# Patient Record
Sex: Female | Born: 1970 | Race: Black or African American | Hispanic: No | Marital: Married | Smoking: Never smoker
Health system: Southern US, Community
[De-identification: ages and names within clinical notes are randomized; demographics above are authoritative.]

## PROBLEM LIST (undated history)

## (undated) DIAGNOSIS — R002 Palpitations: Secondary | ICD-10-CM

## (undated) DIAGNOSIS — O903 Peripartum cardiomyopathy: Secondary | ICD-10-CM

## (undated) DIAGNOSIS — O24419 Gestational diabetes mellitus in pregnancy, unspecified control: Secondary | ICD-10-CM

## (undated) DIAGNOSIS — I5022 Chronic systolic (congestive) heart failure: Secondary | ICD-10-CM

## (undated) DIAGNOSIS — N390 Urinary tract infection, site not specified: Secondary | ICD-10-CM

## (undated) HISTORY — DX: Gestational diabetes mellitus in pregnancy, unspecified control: O24.419

## (undated) HISTORY — DX: Chronic systolic (congestive) heart failure: I50.22

## (undated) HISTORY — DX: Peripartum cardiomyopathy: O90.3

## (undated) HISTORY — PX: TUBAL LIGATION: SHX77

## (undated) HISTORY — DX: Urinary tract infection, site not specified: N39.0

## (undated) HISTORY — DX: Palpitations: R00.2

---

## 2001-12-17 ENCOUNTER — Encounter: Payer: Self-pay | Admitting: Emergency Medicine

## 2001-12-17 ENCOUNTER — Emergency Department (HOSPITAL_COMMUNITY): Admission: EM | Admit: 2001-12-17 | Discharge: 2001-12-17 | Payer: Self-pay | Admitting: *Deleted

## 2001-12-31 ENCOUNTER — Ambulatory Visit (HOSPITAL_COMMUNITY): Admission: RE | Admit: 2001-12-31 | Discharge: 2001-12-31 | Payer: Self-pay | Admitting: Obstetrics and Gynecology

## 2001-12-31 ENCOUNTER — Encounter (INDEPENDENT_AMBULATORY_CARE_PROVIDER_SITE_OTHER): Payer: Self-pay | Admitting: Specialist

## 2002-02-25 ENCOUNTER — Other Ambulatory Visit: Admission: RE | Admit: 2002-02-25 | Discharge: 2002-02-25 | Payer: Self-pay | Admitting: Obstetrics & Gynecology

## 2002-05-03 ENCOUNTER — Other Ambulatory Visit: Admission: RE | Admit: 2002-05-03 | Discharge: 2002-05-03 | Payer: Self-pay | Admitting: Obstetrics and Gynecology

## 2002-10-11 ENCOUNTER — Encounter: Admission: RE | Admit: 2002-10-11 | Discharge: 2002-10-11 | Payer: Self-pay | Admitting: Obstetrics and Gynecology

## 2002-11-05 ENCOUNTER — Inpatient Hospital Stay (HOSPITAL_COMMUNITY): Admission: AD | Admit: 2002-11-05 | Discharge: 2002-11-05 | Payer: Self-pay | Admitting: Obstetrics and Gynecology

## 2002-11-14 ENCOUNTER — Inpatient Hospital Stay (HOSPITAL_COMMUNITY): Admission: AD | Admit: 2002-11-14 | Discharge: 2002-11-16 | Payer: Self-pay | Admitting: Obstetrics and Gynecology

## 2002-12-17 ENCOUNTER — Other Ambulatory Visit: Admission: RE | Admit: 2002-12-17 | Discharge: 2002-12-17 | Payer: Self-pay | Admitting: Obstetrics and Gynecology

## 2003-08-18 ENCOUNTER — Other Ambulatory Visit: Admission: RE | Admit: 2003-08-18 | Discharge: 2003-08-18 | Payer: Self-pay | Admitting: Obstetrics and Gynecology

## 2003-11-27 ENCOUNTER — Inpatient Hospital Stay (HOSPITAL_COMMUNITY): Admission: AD | Admit: 2003-11-27 | Discharge: 2003-12-13 | Payer: Self-pay | Admitting: *Deleted

## 2003-12-11 ENCOUNTER — Encounter (INDEPENDENT_AMBULATORY_CARE_PROVIDER_SITE_OTHER): Payer: Self-pay | Admitting: Specialist

## 2004-01-22 ENCOUNTER — Other Ambulatory Visit: Admission: RE | Admit: 2004-01-22 | Discharge: 2004-01-22 | Payer: Self-pay | Admitting: Obstetrics and Gynecology

## 2006-01-09 ENCOUNTER — Ambulatory Visit: Payer: Self-pay | Admitting: Internal Medicine

## 2006-02-08 ENCOUNTER — Ambulatory Visit: Payer: Self-pay | Admitting: Internal Medicine

## 2006-03-01 ENCOUNTER — Ambulatory Visit: Payer: Self-pay | Admitting: Internal Medicine

## 2006-05-02 ENCOUNTER — Ambulatory Visit: Payer: Self-pay | Admitting: Gastroenterology

## 2006-07-28 ENCOUNTER — Ambulatory Visit: Payer: Self-pay | Admitting: Gastroenterology

## 2007-04-03 ENCOUNTER — Encounter: Admission: RE | Admit: 2007-04-03 | Discharge: 2007-04-03 | Payer: Self-pay | Admitting: Obstetrics and Gynecology

## 2007-05-06 ENCOUNTER — Inpatient Hospital Stay (HOSPITAL_COMMUNITY): Admission: AD | Admit: 2007-05-06 | Discharge: 2007-05-06 | Payer: Self-pay | Admitting: Obstetrics & Gynecology

## 2007-05-09 ENCOUNTER — Ambulatory Visit: Payer: Self-pay | Admitting: Internal Medicine

## 2007-07-13 ENCOUNTER — Inpatient Hospital Stay (HOSPITAL_COMMUNITY): Admission: AD | Admit: 2007-07-13 | Discharge: 2007-07-16 | Payer: Self-pay | Admitting: Obstetrics and Gynecology

## 2007-07-23 ENCOUNTER — Encounter: Payer: Self-pay | Admitting: Internal Medicine

## 2007-07-23 ENCOUNTER — Inpatient Hospital Stay (HOSPITAL_COMMUNITY): Admission: AD | Admit: 2007-07-23 | Discharge: 2007-07-25 | Payer: Self-pay | Admitting: Obstetrics and Gynecology

## 2007-07-23 ENCOUNTER — Ambulatory Visit: Payer: Self-pay | Admitting: Cardiovascular Disease

## 2007-08-10 ENCOUNTER — Ambulatory Visit: Payer: Self-pay | Admitting: Internal Medicine

## 2007-08-10 LAB — CONVERTED CEMR LAB
BUN: 6 mg/dL (ref 6–23)
Basophils Relative: 0.4 % (ref 0.0–1.0)
CO2: 27 meq/L (ref 19–32)
Chloride: 105 meq/L (ref 96–112)
Eosinophils Absolute: 0.2 10*3/uL (ref 0.0–0.6)
Lymphocytes Relative: 46.2 % — ABNORMAL HIGH (ref 12.0–46.0)
Monocytes Relative: 11.3 % — ABNORMAL HIGH (ref 3.0–11.0)
Neutro Abs: 1.8 10*3/uL (ref 1.4–7.7)
Neutrophils Relative %: 38 % — ABNORMAL LOW (ref 43.0–77.0)
RBC: 4.02 M/uL (ref 3.87–5.11)
TSH: 0.37 microintl units/mL (ref 0.35–5.50)
Uric Acid, Serum: 9.7 mg/dL — ABNORMAL HIGH (ref 2.4–7.0)
WBC: 4.7 10*3/uL (ref 4.5–10.5)

## 2007-08-31 ENCOUNTER — Ambulatory Visit: Payer: Self-pay | Admitting: Internal Medicine

## 2007-08-31 LAB — CONVERTED CEMR LAB
BUN: 7 mg/dL (ref 6–23)
Creatinine, Ser: 0.7 mg/dL (ref 0.4–1.2)
GFR calc Af Amer: 122 mL/min
GFR calc non Af Amer: 101 mL/min
Glucose, Bld: 79 mg/dL (ref 70–99)
Potassium: 3.6 meq/L (ref 3.5–5.1)
Pro B Natriuretic peptide (BNP): 13 pg/mL (ref 0.0–100.0)
Sodium: 141 meq/L (ref 135–145)

## 2007-12-26 DIAGNOSIS — J309 Allergic rhinitis, unspecified: Secondary | ICD-10-CM | POA: Insufficient documentation

## 2007-12-26 DIAGNOSIS — Z87448 Personal history of other diseases of urinary system: Secondary | ICD-10-CM | POA: Insufficient documentation

## 2007-12-26 DIAGNOSIS — Z8742 Personal history of other diseases of the female genital tract: Secondary | ICD-10-CM | POA: Insufficient documentation

## 2007-12-27 ENCOUNTER — Ambulatory Visit: Payer: Self-pay | Admitting: Internal Medicine

## 2007-12-27 DIAGNOSIS — L259 Unspecified contact dermatitis, unspecified cause: Secondary | ICD-10-CM | POA: Insufficient documentation

## 2008-03-17 ENCOUNTER — Ambulatory Visit: Payer: Self-pay | Admitting: Internal Medicine

## 2008-03-17 DIAGNOSIS — M25539 Pain in unspecified wrist: Secondary | ICD-10-CM

## 2008-09-26 ENCOUNTER — Ambulatory Visit: Payer: Self-pay | Admitting: Diagnostic Radiology

## 2008-09-26 ENCOUNTER — Ambulatory Visit: Payer: Self-pay | Admitting: Internal Medicine

## 2008-09-26 ENCOUNTER — Ambulatory Visit (HOSPITAL_BASED_OUTPATIENT_CLINIC_OR_DEPARTMENT_OTHER): Admission: RE | Admit: 2008-09-26 | Discharge: 2008-09-26 | Payer: Self-pay | Admitting: Internal Medicine

## 2008-09-26 DIAGNOSIS — R350 Frequency of micturition: Secondary | ICD-10-CM

## 2008-09-26 LAB — CONVERTED CEMR LAB
Nitrite: POSITIVE
Protein, U semiquant: 100
Urobilinogen, UA: 0.2
pH: 6.5

## 2008-09-27 ENCOUNTER — Encounter: Payer: Self-pay | Admitting: Internal Medicine

## 2009-01-02 ENCOUNTER — Encounter: Payer: Self-pay | Admitting: Internal Medicine

## 2009-01-02 ENCOUNTER — Ambulatory Visit: Payer: Self-pay | Admitting: Internal Medicine

## 2009-01-02 DIAGNOSIS — R002 Palpitations: Secondary | ICD-10-CM | POA: Insufficient documentation

## 2009-01-02 DIAGNOSIS — O903 Peripartum cardiomyopathy: Secondary | ICD-10-CM | POA: Insufficient documentation

## 2009-01-07 ENCOUNTER — Ambulatory Visit: Payer: Self-pay | Admitting: Internal Medicine

## 2009-01-07 LAB — CONVERTED CEMR LAB
Albumin: 3.2 g/dL — ABNORMAL LOW (ref 3.5–5.2)
Alkaline Phosphatase: 53 units/L (ref 39–117)
BUN: 3 mg/dL — ABNORMAL LOW (ref 6–23)
Bilirubin, Direct: 0.1 mg/dL (ref 0.0–0.3)
Cholesterol: 174 mg/dL (ref 0–200)
Creatinine, Ser: 0.7 mg/dL (ref 0.4–1.2)
GFR calc non Af Amer: 120.52 mL/min (ref >60)
Hgb A1c MFr Bld: 5.7 % (ref 4.6–6.5)
LDL Cholesterol: 107 mg/dL — ABNORMAL HIGH (ref 0–99)
Potassium: 3.8 meq/L (ref 3.5–5.1)
TSH: 0.27 microintl units/mL — ABNORMAL LOW (ref 0.35–5.50)
Total CHOL/HDL Ratio: 6

## 2009-01-08 ENCOUNTER — Encounter: Payer: Self-pay | Admitting: Internal Medicine

## 2009-01-21 ENCOUNTER — Encounter: Payer: Self-pay | Admitting: Internal Medicine

## 2009-01-21 ENCOUNTER — Ambulatory Visit: Payer: Self-pay

## 2009-03-10 ENCOUNTER — Ambulatory Visit: Payer: Self-pay | Admitting: Internal Medicine

## 2009-03-10 DIAGNOSIS — E0590 Thyrotoxicosis, unspecified without thyrotoxic crisis or storm: Secondary | ICD-10-CM | POA: Insufficient documentation

## 2009-03-10 LAB — CONVERTED CEMR LAB: Free T4: 0.72 ng/dL — ABNORMAL LOW (ref 0.80–1.80)

## 2009-03-12 ENCOUNTER — Telehealth: Payer: Self-pay | Admitting: Internal Medicine

## 2009-04-02 ENCOUNTER — Ambulatory Visit: Payer: Self-pay | Admitting: Internal Medicine

## 2009-06-05 ENCOUNTER — Ambulatory Visit: Payer: Self-pay | Admitting: Internal Medicine

## 2009-06-05 DIAGNOSIS — N390 Urinary tract infection, site not specified: Secondary | ICD-10-CM

## 2009-06-05 LAB — CONVERTED CEMR LAB
Glucose, Urine, Semiquant: NEGATIVE
Urobilinogen, UA: 0.2

## 2009-08-07 ENCOUNTER — Encounter (INDEPENDENT_AMBULATORY_CARE_PROVIDER_SITE_OTHER): Payer: Self-pay | Admitting: *Deleted

## 2009-09-17 ENCOUNTER — Encounter: Payer: Self-pay | Admitting: Internal Medicine

## 2009-09-22 ENCOUNTER — Ambulatory Visit: Payer: Self-pay | Admitting: Cardiology

## 2009-09-22 ENCOUNTER — Encounter: Payer: Self-pay | Admitting: Physician Assistant

## 2009-09-28 LAB — CONVERTED CEMR LAB
BUN: 9 mg/dL (ref 6–23)
Calcium: 9.3 mg/dL (ref 8.4–10.5)
TSH: 0.46 microintl units/mL (ref 0.35–5.50)

## 2009-11-25 ENCOUNTER — Encounter (INDEPENDENT_AMBULATORY_CARE_PROVIDER_SITE_OTHER): Payer: Self-pay | Admitting: *Deleted

## 2010-01-11 ENCOUNTER — Ambulatory Visit: Payer: Self-pay | Admitting: Internal Medicine

## 2010-01-11 DIAGNOSIS — H109 Unspecified conjunctivitis: Secondary | ICD-10-CM | POA: Insufficient documentation

## 2010-01-13 ENCOUNTER — Telehealth: Payer: Self-pay | Admitting: Internal Medicine

## 2010-01-13 ENCOUNTER — Encounter (INDEPENDENT_AMBULATORY_CARE_PROVIDER_SITE_OTHER): Payer: Self-pay | Admitting: *Deleted

## 2010-02-16 ENCOUNTER — Encounter: Payer: Self-pay | Admitting: Internal Medicine

## 2010-02-19 ENCOUNTER — Ambulatory Visit: Payer: Self-pay | Admitting: Internal Medicine

## 2010-07-22 ENCOUNTER — Ambulatory Visit: Payer: Self-pay | Admitting: Internal Medicine

## 2010-07-22 LAB — CONVERTED CEMR LAB
Bilirubin Urine: NEGATIVE
Glucose, Urine, Semiquant: NEGATIVE
Ketones, urine, test strip: NEGATIVE
Protein, U semiquant: 30
Specific Gravity, Urine: <1.005
WBC Urine, dipstick: NEGATIVE

## 2010-07-23 ENCOUNTER — Encounter: Payer: Self-pay | Admitting: Internal Medicine

## 2010-08-19 ENCOUNTER — Ambulatory Visit: Payer: Self-pay | Admitting: Internal Medicine

## 2010-08-19 ENCOUNTER — Ambulatory Visit (HOSPITAL_BASED_OUTPATIENT_CLINIC_OR_DEPARTMENT_OTHER)
Admission: RE | Admit: 2010-08-19 | Discharge: 2010-08-19 | Payer: Self-pay | Source: Home / Self Care | Admitting: Internal Medicine

## 2010-08-19 ENCOUNTER — Telehealth: Payer: Self-pay | Admitting: Internal Medicine

## 2010-08-19 LAB — CONVERTED CEMR LAB: Bilirubin Urine: NEGATIVE

## 2010-10-11 ENCOUNTER — Encounter: Payer: Self-pay | Admitting: Internal Medicine

## 2010-10-18 ENCOUNTER — Ambulatory Visit (INDEPENDENT_AMBULATORY_CARE_PROVIDER_SITE_OTHER)
Admission: RE | Admit: 2010-10-18 | Discharge: 2010-10-18 | Payer: BC Managed Care – PPO | Source: Home / Self Care | Attending: Internal Medicine | Admitting: Internal Medicine

## 2010-10-18 DIAGNOSIS — G2581 Restless legs syndrome: Secondary | ICD-10-CM

## 2010-10-18 DIAGNOSIS — M25569 Pain in unspecified knee: Secondary | ICD-10-CM

## 2010-10-19 ENCOUNTER — Encounter (INDEPENDENT_AMBULATORY_CARE_PROVIDER_SITE_OTHER): Payer: Self-pay | Admitting: *Deleted

## 2010-10-19 NOTE — Letter (Signed)
Summary: Appointment - Missed  [facility], Main Office  [address]   [place], [place] [address]   Phone: [phone]  Fax: [phone]     November 25, 2009 MRN: [id number]   [facility] [address] [place], [place]  [address]   Dear Ms. Popson,  Our records indicate you missed your appointment on 11/20/2009 with Dr. Tenny Craw. It is very important that we reach you to reschedule this appointment. We look forward to participating in your health care needs. Please contact us at the number listed above at your earliest convenience to reschedule this appointment.     Sincerely,   Migdalia Dk [facility] Scheduling Team

## 2010-10-19 NOTE — Letter (Signed)
Summary: Primary Care Consult Scheduled Letter  [place] [facility]  [address]   [place], [place] [address]   Phone: [phone]  Fax: [phone]      01/13/2010 MRN: [id number]  [facility] [address] [place], [place]  [address]    Dear Ms. Elizardo,      We have scheduled an appointment for you.  At the recommendation of Dr._YOO, we have scheduled you a consult with DR Stevphen Rochester, [facility]  on _MAY 17, 2011 at 2:45PM .  Their address is_[address] ,[place] . The office phone number is [phone].  If this appointment day and time is not convenient for you, please feel free to call the office of the doctor you are being referred to at the number listed above and reschedule the appointment.   PLEASE DISCONTINUE ALL ANTIHISTAMINES  3 DAYS PRIOR TO VISIT      It is important for you to keep your scheduled appointments. We are here to make sure you are given good patient care. If you have questions or you have made changes to your appointment, please notify us at  [phone], ask for HELEN.    Thank you, Darral Dash Patient Care Coordinator [place] [facility]

## 2010-10-19 NOTE — Assessment & Plan Note (Signed)
Summary: uti?/mhf   Vital Signs:  Patient profile:   40 year old female Height:      63 inches Weight:      180.50 pounds BMI:     32.09 Temp:     100.8 degrees F oral Pulse rate:   96 / minute Pulse rhythm:   regular Resp:     16 per minute BP sitting:   100 / 70  (right arm) Cuff size:   large  Vitals Entered By: Mervin Kung CMA Duncan Dull) (July 22, 2010 1:30 PM) CC: , , Dysuria Is Patient Diabetic? No Pain Assessment Patient in pain? no      Comments Pt states she is only taking a MVI. Mervin Kung CMA Duncan Dull)  July 22, 2010 1:36 PM    Primary Care Provider:  Dondra Spry DO  CC:  , , and Dysuria.  History of Present Illness:   Dysuria      This is a 40 year old woman who presents with Dysuria.  The patient reports burning with urination and hematuria.  The patient denies the following associated symptoms: nausea, vomiting, and fever. mild chills x 1 day  Preventive Screening-Counseling & Management  Alcohol-Tobacco     Alcohol drinks/day: 0     Alcohol Counseling: not indicated; patient does not drink     Smoking Status: never     Tobacco Counseling: not indicated; no tobacco use  Allergies (verified): No Known Drug Allergies  Past History:  Past Medical History: Peripartum cardiomyopathy Gestational diabetes     Palpitations     Social History: Married, has 3 children, ages 29, 90, and 2.   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  Never Smoked  Alcohol use-no      Physical Exam  General:  alert, well-developed, and well-nourished.  alert, well-developed, and well-nourished.   Lungs:  normal respiratory effort and normal breath sounds.   Heart:  normal rate, regular rhythm, and no gallop.   Abdomen:  mild suprapubic tenderness. no flank tenderness   Impression & Recommendations:  Problem # 1:  UTI (ICD-599.0)  Her updated medication list for this problem includes:    Cefuroxime  Axetil 500 Mg Tabs (Cefuroxime axetil) ..... One by mouth two times a day  Orders: UA Dipstick w/o Micro (manual) ([address]) Rocephin  250mg  (U0454) Admin of Therapeutic Inj  intramuscular or subcutaneous ([address]) Specimen Handling ([address]) T-Culture, Urine ([address])  Encouraged to push clear liquids, get enough rest, and take acetaminophen as needed. To be seen in 10 days if no improvement, sooner if worse.  Her updated medication list for this problem includes:    Cefuroxime Axetil 500 Mg Tabs (Cefuroxime axetil) ..... One by mouth two times a day  Complete Medication List: 1)  Multivitamins Tabs (Multiple vitamin) .Marland Kitchen.. 1 tab qday 2)  Cefuroxime Axetil 500 Mg Tabs (Cefuroxime axetil) .... One by mouth two times a day  Patient Instructions: 1)  Drink plenty of fluids 2)  Take full course antibiotics 3)  Call our office if your symptoms do not  improve or gets worse. Prescriptions: CEFUROXIME AXETIL 500 MG TABS (CEFUROXIME AXETIL) one by mouth two times a day  #14 x 0   Entered and Authorized by:   D. Thomos Lemons DO   Signed by:   D. Thomos Lemons DO on 07/22/2010   Method used:   Electronically to        [facility]  [address] [phone]* (retail)       [address]       [place], [place]  [address]       Ph: [phone]       Fax: [phone]   RxID:   [id number]    Medication Administration  Injection # 1:    Medication: Rocephin  250mg     Diagnosis: UTI (ICD-599.0)    Route: IM    Site: LUOQ gluteus    Exp Date: 07/19/2012    Lot #: [id number]    Mfr: Sandoz    Patient tolerated injection without complications    Given by: Mervin Kung CMA Duncan Dull) (July 22, 2010 1:55 PM)  Orders Added: 1)  UA Dipstick w/o Micro (manual) [81002] 2)  Est. Patient Level III [[address]270] 3)  Rocephin  250mg  [J0696] 4)  Admin of Therapeutic Inj  intramuscular or subcutaneous [96[address]] 5)  Specimen Handling [99000] 6)  T-Culture, Urine [35009-38182] 7)  Est. Patient Level III  [99371]     Current Allergies (reviewed today): No known allergies   Laboratory Results   Urine Tests   Date/Time Reported: Mervin Kung CMA (AAMA)  July 22, 2010 1:50 PM   Routine Urinalysis   Color: yellow Appearance: Clear Glucose: negative   (Normal Range: Negative) Bilirubin: negative   (Normal Range: Negative) Ketone: negative   (Normal Range: Negative) Spec. Gravity: <1.005   (Normal Range: 1.003-1.035) Blood: large   (Normal Range: Negative) pH: 7.5   (Normal Range: 5.0-8.0) Protein: 30   (Normal Range: Negative) Urobilinogen: 2.0   (Normal Range: 0-1) Nitrite: negative   (Normal Range: Negative) Leukocyte Esterace: negative   (Normal Range: Negative)    Comments: Urine cultured. Nicki Guadalajara Fergerson CMA Duncan Dull)  July 22, 2010 1:53 PM

## 2010-10-19 NOTE — Progress Notes (Signed)
Summary: Ultrasound Results  Phone Note Outgoing Call   Summary of Call: call pt - renal u/s normal Initial call taken by: D. Thomos Lemons DO,  August 19, 2010 12:53 PM  Follow-up for Phone Call        call placed to patient at [phone], no answer. A detailed  voice message  was left informing patient per Dr Artist Pais instructions. Message was left for patient to cal if any questions Follow-up by: Glendell Docker CMA,  August 19, 2010 1:55 PM

## 2010-10-19 NOTE — Assessment & Plan Note (Signed)
Summary: Allergies/hea   Vital Signs:  Patient profile:   40 year old female Height:      63 inches Weight:      182.75 pounds BMI:     32.49 O2 Sat:      98 % on Room air Temp:     97.9 degrees F oral Pulse rate:   90 / minute Pulse rhythm:   regular Resp:     18 per minute BP sitting:   100 / 66  (right arm) Cuff size:   large  Vitals Entered By: Glendell Docker CMA (January 11, 2010 1:35 PM)  O2 Flow:  Room air CC: Rm 2- Allergie Comments c/o swelling of face,eye drainage, aggravated allergies, taken otc meds with little improvement   Primary Care Provider:  Dondra Spry DO  CC:  Rm 2- Allergie.  History of Present Illness:       This is a 40 year old female who presents with allergic rhinitis.  The patient complains of nasal congestion, post nasal drip, and itchy eyes.  Patient states symptoms are worse with exposure to outdoors.  Previous effective treatment includes fexofenadine and cetirizine.    prev allergy panel reviewed  Preventive Screening-Counseling & Management  Alcohol-Tobacco     Smoking Status: never  Allergies (verified): No Known Drug Allergies  Past History:  Past Medical History: Peripartum cardiomyopathy Gestational diabetes    Palpitations     Family History: Mother is at age 32, has hypertension and type 2 diabetes.  Father is age 70 with Parkinson's disease.  Patient has 2 brothers and 4 sisters, no known medical issues.  Denies any family history of colon cancer, breast cancer      Social History: Married, has 3 children, ages 40, 59, and 2.   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  Never Smoked  Alcohol use-no     Physical Exam  General:  alert, well-developed, and well-nourished.   Head:  normocephalic and atraumatic.   Eyes:  right conjunctival injection Lungs:  normal respiratory effort and normal breath sounds.   Heart:  normal rate, regular rhythm, and no gallop.      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  no improvement with otc antihistamines.   tx with prednisone taper and start nasal steroids and antihistamine refer to allergist - she would like to consider allergy shots or drops  Her updated medication list for this problem includes:    Veramyst 27.5 Mcg/spray Susp (Fluticasone furoate) .Marland Kitchen... 2 sprays each nostril once daily    Azelastine Hcl 137 Mcg/spray Soln (Azelastine hcl) .Marland Kitchen... 2 sprays each nostril two times a day  Orders: Allergy Referral  (Allergy)  Problem # 2:  CONJUNCTIVITIS (ICD-372.30) right eye is red.  she reports eye discharge in AM.   tx with tobradex.   also start patanol drops Patient advised to call office if symptoms persist or worsen.  Complete Medication List: 1)  Multivitamins Tabs (Multiple vitamin) .Marland Kitchen.. 1 tab qday 2)  Coreg 3.125 Mg Tabs (Carvedilol) .... One half a tablet 2 times per day 3)  Veramyst 27.5 Mcg/spray Susp (Fluticasone furoate) .... 2 sprays each nostril once daily 4)  Prednisone 10 Mg Tabs (Prednisone) .... 3 tabs by mouth once daily x 3 days, 2 tabs by mouth once daily x 3 days, 1 tab by mouth once daily x 3  days 5)  Tobramycin-dexamethasone 0.3-0.1 % Susp (Tobramycin-dexamethasone) .... 2 drops to each eye qid x 1 week 6)  Patanol 0.1 % Soln (Olopatadine hcl) .... One drop to each eye once daily 7)  Azelastine Hcl 137 Mcg/spray Soln (Azelastine hcl) .... 2 sprays each nostril two times a day  Patient Instructions: 1)  Call our office if your symptoms do not  improve or gets worse. Prescriptions: AZELASTINE HCL 137 MCG/SPRAY SOLN (AZELASTINE HCL) 2 sprays each nostril two times a day  #1 bottle x 2   Entered and Authorized by:   D. Thomos Lemons DO   Signed by:   D. Thomos Lemons DO on 01/11/2010   Method used:   Electronically to        [facility] [phone]* (retail)       [address]       [place], [place]   [address]       Ph: [phone]       Fax: [phone]   RxID:   [id number] PATANOL 0.1 % SOLN (OLOPATADINE HCL) one drop to each eye once daily  #1 bottle x 3   Entered and Authorized by:   D. Thomos Lemons DO   Signed by:   D. Thomos Lemons DO on 01/11/2010   Method used:   Electronically to        [facility] [phone]* (retail)       [address]       [place], [place]  [address]       Ph: [phone]       Fax: [phone]   RxID:   [id number] TOBRAMYCIN-DEXAMETHASONE 0.3-0.1 % SUSP (TOBRAMYCIN-DEXAMETHASONE) 2 drops to each eye qid x 1 week  #1 bottle x 0   Entered and Authorized by:   D. Thomos Lemons DO   Signed by:   D. Thomos Lemons DO on 01/11/2010   Method used:   Electronically to        [facility] [phone]* (retail)       [address]       [place], [place]  [address]       Ph: [phone]       Fax: [phone]   RxID:   [id number] PREDNISONE 10 MG TABS (PREDNISONE) 3 tabs by mouth once daily x 3 days, 2 tabs by mouth once daily x 3 days, 1 tab by mouth once daily x 3 days  #18 x 0   Entered and Authorized by:   D. Thomos Lemons DO   Signed by:   D. Thomos Lemons DO on 01/11/2010   Method used:   Electronically to        [facility] [phone]* (retail)       [address]       [place], [place]  [address]       Ph: [phone]       Fax: [phone]   RxID:   [id number] VERAMYST 27.5 MCG/SPRAY SUSP (FLUTICASONE FUROATE) 2 sprays each nostril once daily  #1 bottle x 3   Entered and Authorized by:   D. Thomos Lemons DO   Signed by:   D. Thomos Lemons DO on 01/11/2010   Method used:   Electronically to        [facility] [phone]* (retail)       [address]       [place], [place]  [address]  Ph: [phone]       Fax: [phone]   RxID:   [id number]   Current Allergies (reviewed today): No known allergies    Immunization History:  Influenza Immunization History:    Influenza:  historical (07/07/2009)    Preventive Care Screening  Pap  Smear:    Date:  12/08/2009    Results:  normal

## 2010-10-19 NOTE — Letter (Signed)
Summary: [facility]  [facility]   Imported By: Lanelle Bal 04/06/2010 09:13:41  _____________________________________________________________________  External Attachment:    Type:   Image     Comment:   External Document

## 2010-10-19 NOTE — Assessment & Plan Note (Signed)
Summary: ROV/PALPS   Visit Type:  rov Primary Provider:  Dondra Spry DO  CC:  palpitations...  History of Present Illness: This is a 40 year old female patient, who has a history of a peripartum cardiomyopathy. She also has history of palpitations. Holter monitor back in May of 2010, showed PACs and PVCs. 2-D echo showed mild decreased LV systolic function. Ejection fraction 45-50%. Dr. Tenny Craw, start her on low dose Coreg, a half of a 3.125 mg b.i.d.  The patient comes in today complaining of several week history of increased palpitations. She says some days they occur off-and-on all day long and other days. She doesn't have any. 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 She started exercising a month ago and says she doesn't notice them when she is exercising. She denies associated chest pain, dizziness, dyspnea, or presyncope.  Current Medications (verified): 1)  Multivitamins   Tabs (Multiple Vitamin) .Marland Kitchen.. 1 Tab Qday 2)  Coreg 3.125 Mg Tabs (Carvedilol) .... One Half A Tablet 2 Times Per Day  Allergies (verified): No Known Drug Allergies  Past History:  Past Medical History: Last updated: 06/05/2009 Peripartum cardiomyopathy Gestational diabetes    Palpitations    Social History: Last updated: 06/05/2009 Married, has 3 children, ages 40, 57, and 2.   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  Never Smoked  Alcohol use-no    Review of Systems       see history of present illness, otherwise, negative  Vital Signs:  Patient profile:   40 year old female Height:      63 inches Weight:      193 pounds BMI:     34.31 Pulse rate:   70 / minute Pulse rhythm:   regular BP sitting:   100 / 60  (right arm) Cuff size:   large  Vitals Entered By: Danielle Rankin, CMA (September 22, 2009 12:38 PM)  Physical  Exam  General:   Well-nournished, in no acute distress. Neck: No JVD, HJR, Bruit, or thyroid enlargement Lungs: No tachypnea, clear without wheezing, rales, or rhonchi Cardiovascular: RRR, PMI not displaced, heart sounds normal, no murmurs, gallops, bruit, thrill, or heave. Abdomen: BS normal. Soft without organomegaly, masses, lesions or tenderness. Extremities: without cyanosis, clubbing or edema. Good distal pulses bilateral SKin: Warm, no lesions or rashes  Musculoskeletal: No deformities Neuro: no focal signs    EKG  Procedure date:  09/22/2009  Findings:      normal sinus rhythm, no acute change  Impression & Recommendations:  Problem # 1:  PALPITATIONS (ICD-785.1) Patient complains of several week history of increased palpitations worsen by caffeine use. Have asked her to decrease her caffeine intake. We will increase her Coreg to 3.125 mg one b.i.d. as tolerated. If her blood pressure does not tolerate this she could take it one in the morning and half in the evening. I also told her she could take an extra tablet during the palpitations if needed. She will keep a check on her blood pressure. We will also check a TSH and BMET. Her updated medication list for this problem includes:    Coreg 3.125 Mg Tabs (Carvedilol) ..... One half a tablet 2 times per day  Problem # 2:  PERIPARTUM CARDIOMYPATH DELIV W/WO ANTPRTM COND (ZOX-096.04) Patient's last echo was in May 2010, at which  time a ejection fraction was 45-50%. I will not repeat this. She is asymptomatic.  Other Orders: TLB-BMP (Basic Metabolic Panel-BMET) (80048-METABOL) TLB-TSH (Thyroid Stimulating Hormone) (04540-JWJ)  Patient Instructions: 1)  Your physician recommends that you schedule a follow-up appointment in: 1 - 2 months with Dr. Tenny Craw. 2)  Your physician recommends that you return for lab work today: TSH, BMP. 3)  Your physician has recommended you make the following change in your medication: increase coreg to  one tablet twice a day if blood pressure tolerates it; or, take one tablet in the morning and a half a tablet in the evening if blood pressure tolerates it.

## 2010-10-19 NOTE — Progress Notes (Signed)
Summary: Prior Authorization needed on Veramyst/Medication Change  Phone Note From Pharmacy   Caller: [facility] Reason for Call: Medication not on formulary Summary of Call: fax from pharmacy for prior authorization on Veramyst, not covered by insurance ID # [id number], [phone] Initial call taken by: Glendell Docker CMA,  January 13, 2010 12:03 PM  Follow-up for Phone Call        per Dr Artist Pais change to flonase with same directions Follow-up by: Glendell Docker CMA,  January 13, 2010 6:04 PM  Additional Follow-up for Phone Call Additional follow up Details #1::        call placed to patient at [phone], no answer, voice message left for patient to return call regarding medication change Additional Follow-up by: Glendell Docker CMA,  January 13, 2010 6:07 PM    Additional Follow-up for Phone Call Additional follow up Details #2::    Patient notified. Follow-up by: Lucious Groves,  January 15, 2010 2:24 PM  New/Updated Medications: FLONASE 50 MCG/ACT SUSP (FLUTICASONE PROPIONATE) 2 sprays each nostril once daily Prescriptions: FLONASE 50 MCG/ACT SUSP (FLUTICASONE PROPIONATE) 2 sprays each nostril once daily  #1 x 3   Entered by:   Glendell Docker CMA   Authorized by:   D. Thomos Lemons DO   Signed by:   Glendell Docker CMA on 01/13/2010   Method used:   Electronically to        [facility] [phone]* (retail)       [address]       [place], [place]  [address]       Ph: [phone]       Fax: [phone]   RxID:   [id number]

## 2010-10-21 NOTE — Assessment & Plan Note (Signed)
Summary: uti not better/mhf   Vital Signs:  Patient profile:   40 year old female Height:      63 inches Weight:      180.75 pounds BMI:     32.13 O2 Sat:      100 % on Room air Temp:     98.1 degrees F oral Pulse rate:   68 / minute Resp:     16 per minute BP sitting:   116 / 70  (right arm) Cuff size:   large  Vitals Entered By: Glendell Docker CMA (August 19, 2010 10:12 AM)  O2 Flow:  Room air CC: UTI Is Patient Diabetic? No Pain Assessment Patient in pain? no        Primary Care Provider:  Dondra Spry DO  CC:  UTI.  History of Present Illness: 40 y/o female c/o , stingiing with urination, frequency, and urinary odor, denies temp  seen 2 week ago - symptoms improved with prev abx  denies hx of GU abnormality  Preventive Screening-Counseling & Management  Alcohol-Tobacco     Smoking Status: never  Allergies (verified): No Known Drug Allergies  Past History:  Past Medical History: Peripartum cardiomyopathy Gestational diabetes     Palpitations      Past Surgical History: Tubal ligation  Social History: Married, has 3 children, ages 29, 15, and 2.   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  Never Smoked   Alcohol use-no      Review of Systems       no hx of genitourologic abnormality  Physical Exam  General:  alert, well-developed, and well-nourished.   Lungs:  normal respiratory effort and normal breath sounds.   Heart:  normal rate, regular rhythm, and no gallop.   Abdomen:  soft and non-tender.  mild flank tenderness   Impression & Recommendations:  Problem # 1:  UTI (ICD-599.0) pt got better after prev UTI but symptoms returned prev urine culture showed E Coli - sensitive to cephalosporins  tx with cipro re culture check renal u/s we discussed urologic eval is UTIs continue to recur  The following medications were removed from the medication list:    Cefuroxime Axetil 500 Mg Tabs  (Cefuroxime axetil) ..... One by mouth two times a day Her updated medication list for this problem includes:    Ciprofloxacin Hcl 500 Mg Tabs (Ciprofloxacin hcl) ..... One by mouth two times a day  Orders: UA Dipstick w/o Micro (manual) ([address]) Ultrasound (Ultrasound) T-Culture, Urine ([address]) Specimen Handling ([address])  Complete Medication List: 1)  Multivitamins Tabs (Multiple vitamin) .Marland Kitchen.. 1 tab qday 2)  Ciprofloxacin Hcl 500 Mg Tabs (Ciprofloxacin hcl) .... One by mouth two times a day  Patient Instructions: 1)  Call our office if your symptoms do not  improve or gets worse. Prescriptions: CIPROFLOXACIN HCL 500 MG TABS (CIPROFLOXACIN HCL) one by mouth two times a day  #20 x 0   Entered and Authorized by:   D. Thomos Lemons DO   Signed by:   D. Thomos Lemons DO on 08/19/2010   Method used:   Electronically to        [facility] [phone]* (retail)       [address]       [place], [place]  [address]       Ph: [phone]       Fax: [phone]   RxID:   [id number]  Orders Added: 1)  UA Dipstick w/o Micro (manual) [81002] 2)  Ultrasound [Ultrasound] 3)  T-Culture, Urine [[address]] 4)  Specimen Handling [99[address]] 5)  Est. Patient Level III [09811]    Current Allergies (reviewed today): No known allergies   Laboratory Results   Urine Tests    Routine Urinalysis   Color: straw Appearance: Cloudy Glucose: negative   (Normal Range: Negative) Bilirubin: negative   (Normal Range: Negative) Ketone: negative   (Normal Range: Negative) Spec. Gravity: 1.015   (Normal Range: 1.003-1.035) Blood: small   (Normal Range: Negative) pH: 5.0   (Normal Range: 5.0-8.0) Protein: trace   (Normal Range: Negative) Urobilinogen: 0.2   (Normal Range: 0-1) Nitrite: negative   (Normal Range: Negative) Leukocyte Esterace: large   (Normal Range: Negative)

## 2010-10-27 NOTE — Letter (Signed)
Summary: Primary Care Consult Scheduled Letter  [place] at [facility]  [address]   [place], [place] [address]   Phone: [phone]  Fax: [phone]      10/19/2010 MRN: [id number]  [facility] [address] [place], [place]  [address]    Dear Ms. Pitcock,      We have scheduled an appointment for you.  At the recommendation of Dr.YOO, we have scheduled you a consult with DR GIOFFRE, [facility]  on SATURDAY FEBRUARY 4,2012 at 8:30AM .  Their address is_[address] ,[place]  [id number]. The office phone number is _[phone].  If this appointment day and time is not convenient for you, please feel free to call the office of the doctor you are being referred to at the number listed above and reschedule the appointment.     It is important for you to keep your scheduled appointments. We are here to make sure you are given good patient care.    Thank you,  Darral Dash Patient Care Coordinator [place] at [facility]

## 2010-11-10 NOTE — Assessment & Plan Note (Signed)
Summary: pain in feet/mhf   Vital Signs:  Patient profile:   40 year old female Height:      63 inches Weight:      182.50 pounds BMI:     32.45 O2 Sat:      18 % on Room air Temp:     98.5 degrees F oral Pulse rate:   74 / minute Resp:     18 per minute BP sitting:   102 / 70  (right arm) Cuff size:   large  Vitals Entered By: Glendell Docker CMA (October 18, 2010 3:49 PM)  O2 Flow:  Room air CC: Pain in legs Is Patient Diabetic? No Pain Assessment Patient in pain? yes     Location: Legs& Feet Intensity: 5 Type: aching Onset of pain  With activity Comments c/o bilatral leg and feet and leg pain for the pase month    Primary Care Provider:  Dondra Spry DO  CC:  Pain in legs.  History of Present Illness: 40 y/o female c/o bilateral leg pain no injury or trauma symptoms much worse at night  also co left knee pain  Preventive Screening-Counseling & Management  Alcohol-Tobacco     Smoking Status: never  Allergies (verified): No Known Drug Allergies  Past History:  Past Medical History: Peripartum cardiomyopathy Gestational diabetes      Palpitations      Family History: Mother is at age 47, has hypertension and type 2 diabetes.  Father is age 78 with Parkinson's disease.  Patient has 2 brothers and 4 sisters, no known medical issues.  Denies any family history of colon cancer, breast cancer       Social History: Married, has 3 children, ages 75, 28, and 2.   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  Never Smoked   Alcohol use-no       Physical Exam  General:  alert, well-developed, and well-nourished.   Lungs:  normal respiratory effort and normal breath sounds.   Heart:  normal rate, regular rhythm, and no gallop.   Msk:  no redness over joints and no joint instability.   Pulses:  dorsalis pedis and posterior tibial pulses are full and equal bilaterally   Impression & Recommendations:  Problem #  1:  KNEE PAIN, LEFT (ICD-719.46) refer to ortho for further eval and tx  Orders: Orthopedic Referral (Ortho)  Problem # 2:  RESTLESS LEG SYNDROME (ICD-333.94)  aching legs esp at night trial of requip rule out iron deficiency   Orders: T-Basic Metabolic Panel [phone]) T-CBC No Diff [id number]) T-TSH [id number]) T-Iron Binding Capacity (TIBC) ([id number]) T-Iron ([id number]) T-Ferritin (10272-53664)  Complete Medication List: 1)  Multivitamins Tabs (Multiple vitamin) .Marland Kitchen.. 1 tab qday 2)  Ropinirole Hcl 0.25 Mg Tabs (Ropinirole hcl) .... 1/2 by mouth at bedtime x 2 weeks, then 1 by mouth at bedtime  Patient Instructions: 1)  Please schedule a follow-up appointment in 2 months. Prescriptions: ROPINIROLE HCL 0.25 MG TABS (ROPINIROLE HCL) 1/2 by mouth at bedtime x 2 weeks, then 1 by mouth at bedtime  #30 x 1   Entered and Authorized by:   D. Thomos Lemons DO   Signed by:   D. Thomos Lemons DO on 10/18/2010   Method used:   Electronically to        [facility] [phone]* (retail)       [address]       [place], [place]  [address]       Ph: [phone]       Fax: [phone]   RxID:   [id number]    Orders Added: 1)  T-Basic Metabolic Panel [id number] 2)  T-CBC No Diff [[id number]] 3)  T-TSH [27253-66440] 4)  T-Iron Binding Capacity (TIBC) [[address]] 5)  T-Iron [[id number]] 6)  T-Ferritin [[id number]] 7)  Orthopedic Referral [Ortho] 8)  Est. Patient Level III [30160]   Immunization History:  Influenza Immunization History:    Influenza:  historical (08/05/2010)   Immunization History:  Influenza Immunization History:    Influenza:  Historical (08/05/2010)  Current Allergies (reviewed today): No known allergies

## 2010-11-22 ENCOUNTER — Encounter: Payer: Self-pay | Admitting: Family

## 2010-12-13 ENCOUNTER — Encounter: Payer: Self-pay | Admitting: Family

## 2010-12-14 ENCOUNTER — Ambulatory Visit (INDEPENDENT_AMBULATORY_CARE_PROVIDER_SITE_OTHER): Payer: BC Managed Care – PPO | Admitting: Internal Medicine

## 2010-12-14 ENCOUNTER — Encounter: Payer: Self-pay | Admitting: Internal Medicine

## 2010-12-14 DIAGNOSIS — E785 Hyperlipidemia, unspecified: Secondary | ICD-10-CM

## 2010-12-14 DIAGNOSIS — Z23 Encounter for immunization: Secondary | ICD-10-CM

## 2010-12-14 DIAGNOSIS — J309 Allergic rhinitis, unspecified: Secondary | ICD-10-CM | POA: Insufficient documentation

## 2010-12-14 DIAGNOSIS — R6889 Other general symptoms and signs: Secondary | ICD-10-CM

## 2010-12-14 DIAGNOSIS — R7989 Other specified abnormal findings of blood chemistry: Secondary | ICD-10-CM

## 2010-12-14 DIAGNOSIS — R002 Palpitations: Secondary | ICD-10-CM

## 2010-12-14 DIAGNOSIS — G2581 Restless legs syndrome: Secondary | ICD-10-CM

## 2010-12-14 DIAGNOSIS — Z0000 Encounter for general adult medical examination without abnormal findings: Secondary | ICD-10-CM

## 2010-12-14 LAB — CBC
Hemoglobin: 12.9 g/dL (ref 12.0–15.0)
MCHC: 34.3 g/dL (ref 30.0–36.0)
RBC: 4.42 MIL/uL (ref 3.87–5.11)
RDW: 14.2 % (ref 11.5–15.5)

## 2010-12-14 MED ORDER — MOMETASONE FUROATE 50 MCG/ACT NA SUSP: 2.0000 | Freq: Every day | NASAL | Status: DC

## 2010-12-14 MED ORDER — FEXOFENADINE HCL 180 MG PO TABS: 180.0000 mg | ORAL_TABLET | Freq: Every day | ORAL | Status: DC

## 2010-12-14 NOTE — Assessment & Plan Note (Signed)
Reviewed adult health maintenance protocols. Pt updated with Tdap Pt counseled on diet and exercise (calorie counting and meal planning)

## 2010-12-14 NOTE — Patient Instructions (Signed)
Our office will contact you re:  Blood test results If your palpitations get worse, I suggest you follow up with Dr. Tenny Craw Please try to avoid caffeine use [email or web address]

## 2010-12-14 NOTE — Assessment & Plan Note (Signed)
Pt experiencing seasonal flare.  Use allegra and nasonex as directed

## 2010-12-14 NOTE — Progress Notes (Signed)
  Subjective:    Patient ID: Danielle Burns, female    DOB: 10/06/1970, 40 y.o.   MRN: [id number]  HPI 40 y/o female for routine CPX  Interval hx:  Seen by Dr. Darrelyn Hillock.  Tried cortisone shot .  Only helped x 1 week. MRI of left knee planned  Obesity - she joined gym.  Lost 5 lbs but wt comes right back  RLS symptoms much better.  She never tried requip  Hx of palpitations - worse than usual.  No dizziness but notices heart races.  Has to cough to slow down heart rate. Previous w/u with Dr. Tenny Craw - holter negative for significant arrythmia She has hx of post partum cardiomyopathy.   2 D echo repeated in 01/2009  1. Left ventricle: The cavity size was normal. Wall thickness was    normal. Systolic function was mildly reduced. The estimated    ejection fraction was in the range of 45% to 50%. Wall motion was    normal; there were no regional wall motion abnormalities.    Features are consistent with a pseudonormal left ventricular    filling pattern, with concomitant abnormal relaxation and    increased filling pressure (grade 2 diastolic dysfunction). 2. Tricuspid valve: Trivial regurgitation. 3. Pericardium, extracardiac: A trivial pericardial effusion was    identified.   Review of Systems  Constitutional: Negative for activity change, appetite change and unexpected weight change.  HENT: Negative for hearing loss.   Eyes: Negative.  Negative for visual disturbance.  Respiratory: Negative for chest tightness and shortness of breath.   Cardiovascular: Positive for palpitations. Negative for chest pain.  Gastrointestinal: Negative for abdominal distention.  Hematological: Negative.   Psychiatric/Behavioral: Negative.        Objective:   Physical Exam  Constitutional: She appears well-developed and well-nourished. No distress.  HENT:  Head: Normocephalic and atraumatic.  Right Ear: External ear normal.  Eyes: Conjunctivae are normal.  Neck: Normal range of motion. Neck supple.    Cardiovascular: Normal rate, regular rhythm and normal heart sounds.   Lymphadenopathy:    She has no cervical adenopathy.     c/o allergy sypmtoms - itchy eyes, nasal congestion    Assessment & Plan:

## 2010-12-14 NOTE — Assessment & Plan Note (Addendum)
Pt having recurrent palpitations (likey benign PACs or PVCs) EKG is normal Check electrolytes, CBC and TFTs She has hx of post partum cardiomyopathy 2 D Echo 01/2009 1. Left ventricle: The cavity size was normal. Wall thickness was    normal. Systolic function was mildly reduced. The estimated    ejection fraction was in the range of 45% to 50%. Wall motion was    normal; there were no regional wall motion abnormalities.    Features are consistent with a pseudonormal left ventricular    filling pattern, with concomitant abnormal relaxation and    increased filling pressure (grade 2 diastolic dysfunction). 2. Tricuspid valve: Trivial regurgitation. 3. Pericardium, extracardiac: A trivial pericardial effusion was    identified.  Pt advised to decrease caffeine use.   If persistent symptoms, pt advised to follow up with Dr. Tenny Craw

## 2010-12-15 ENCOUNTER — Encounter: Payer: Self-pay | Admitting: Internal Medicine

## 2010-12-15 LAB — BASIC METABOLIC PANEL WITH GFR
BUN: 10 mg/dL (ref 6–23)
Chloride: 103 mEq/L (ref 96–112)
Glucose, Bld: 68 mg/dL — ABNORMAL LOW (ref 70–99)
Potassium: 4 mEq/L (ref 3.5–5.3)
Sodium: 140 mEq/L (ref 135–145)

## 2010-12-15 LAB — LIPID PANEL
Cholesterol: 205 mg/dL — ABNORMAL HIGH (ref 0–200)
HDL: 66 mg/dL (ref >39)
Triglycerides: 100 mg/dL (ref <150)

## 2010-12-15 LAB — IBC PANEL: TIBC: 375 ug/dL (ref 250–470)

## 2010-12-21 NOTE — Letter (Signed)
   [facility] [address] [place], [place]  [address]  [place] Phone: [phone]      December 15, 2010   Danielle Burns [address] [place], [place] [address]  RE:  LAB RESULTS  Dear  [name],  The following is an interpretation of your most recent lab tests.  Please take note of any instructions provided or changes to medications that have resulted from your lab work.  ELECTROLYTES:  Good - no changes needed  KIDNEY FUNCTION TESTS:  Good - no changes needed  LIPID PANEL:  Good - no changes needed Triglyceride: 100   Cholesterol: 205   LDL: 119   HDL: 66   Chol/HDL%:  3.1 Ratio  THYROID STUDIES:  Thyroid studies normal TSH: 1.134      CBC:  Good - no changes needed  Iron studies - normal       Sincerely Yours,    Dr. Thomos Lemons  Appended Document:  Mailed.

## 2011-02-01 NOTE — H&P (Signed)
Danielle Burns, Danielle Burns                 ACCOUNT NO.:  [id number]   MEDICAL RECORD NO.:  [id number]          PATIENT TYPE:  INP   LOCATION:  9374                          [facility]   PHYSICIAN:  Lenoard Aden, M.D.DATE OF BIRTH:  [birth year]   DATE OF ADMISSION:  07/23/2007  DATE OF DISCHARGE:                              HISTORY & PHYSICAL   CHIEF COMPLAINT:  Shortness of breath.   She is a 40 year old African American female status post uncomplicated  delivery by C-section on 07/13/07 who presents with a 3-day history of  increased shortness of breath.   SHE HAS NO KNOWN DRUG ALLERGIES.   MEDICATIONS:  1. Prenatal vitamins.  2. Previously on glyburide.   She has a family history of sickle cell trait, tuberculosis, chronic  hypertension, diabetes.  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  She had a vaginal delivery complicated by gestational diabetes,  another vaginal delivery complicated by gestational diabetes, and a 3rd  vaginal delivery complicated by premature labor.   PHYSICAL EXAM:  She is an uncomfortable-appearing African American  female in mild respiratory distress.  She is currently on nasal cannula  O2 upon my appearance.  Blood pressure is 130s/70s.  Pulse of 70.  Respirations remain currently at 18 to 20.  HEENT:  Normal.  LUNGS:  Clear in the upper bases but partially decreased breath sounds  in bilateral lower bases are noted.  HEART:  Bradycardic.  No murmurs, rubs, or gallops noted.  ABDOMEN:  Soft and nontender.  Abdominal incision transverse skin  incision from C-section intact.  EXTREMITIES:  Reveal 1 to 2+ edema.  No vaginal bleeding noted.  DTRs are 2+.  No evidence of clonus.  PELVIC:  Deferred.  NEUROLOGIC:  Nonfocal.   Chest x-ray reveals bilateral pleural effusion consistent with  pulmonary  edema and cardiomegaly.  She has laboratory values remarkable for  hemoglobin of 8.3 and chemistries revealing an elevated LDH, elevated  uric acid, normal liver function tests, and otherwise normal complete  metabolic profile with a normal creatinine of 0.7.   IMPRESSION:  Postpartum shortness of breath, rule out postpartum  preeclampsia versus embolism, gout versus probable peripartum  cardiomyopathy.   PLAN:  Give supportive care, saline lock, O2, 20 of Lasix IV.  We will  call for cardiology consult, probable echocardiogram, and optimization  therapy pending cardiac consult.      Lenoard Aden, M.D.  Electronically Signed     RJT/MEDQ  D:  07/23/2007  T:  07/24/2007  Job:  [id number]   cc:   Lenoard Aden, M.D.  Fax: [phone]

## 2011-02-01 NOTE — Assessment & Plan Note (Signed)
[facility]                            CARDIOLOGY OFFICE NOTE   NAME:Danielle Burns, Danielle Burns                          MRN:          [id number]  DATE:08/10/2007                            DOB:          03/11/1971    IDENTIFICATION:  The patient is a 40 year old woman who was recently  admitted to [facility] in early November for shortness of breath.  She is status post cesarean section on July 13, 2007, (para 3).   The patient was found to be in heart failure.  Echocardiogram was done  that showed an LVEF of 35-40% with mild mitral regurgitation.  She was  diuresed and sent home on Lasix and potassium.  I do not have a  discharge summary or the initial cardiology consult for review.  It  appears that the patient's blood pressure would not tolerate any other  medicines.   Since seen, her shortness of breath and leg swelling have improved.  She  is taking activities as tolerated.  She is breastfeeding.  She denies  chest pain, no PND.   CURRENT MEDICATIONS:  1. Multivitamin daily.  2. Lasix 20 mg b.i.d.  3. Potassium 20 mg daily.  4. Iron supplement, question dose daily.   PHYSICAL EXAMINATION:  GENERAL:  The patient is in no distress.  VITAL SIGNS:  Blood pressure 113/70, pulse 77 and regular, weight 201  pounds.  NECK:  JVP is normal.  LUNGS:  Clear without rales or wheezes.  HEART:  Regular rate and rhythm S1 and S2, no S3 and no murmurs.  ABDOMEN:  Benign and slightly obese.  EXTREMITIES:  No edema.   12-lead EKG; normal sinus rhythm at 77 beats per minute.   IMPRESSION:  The patient is a 40 year old peripartum cardiomyopathy.  Volume status looks fairly good today.  I would like to check labs (BNP,  TSH, BMET, uric acid, CBC, and platelets as they were somewhat abnormal  in the hospital, and follow her electrolytes and fluid status).  She  should be on a beta blocker and/or ACE inhibitor.  I think her blood  pressure would tolerate, but  she is breastfeeding.  I have spent time  talking to her about the benefits of these medicines from the cardiac  standpoint.  I think for her they outweigh and should take precedence  over continued breastfeeding.  She seems to understand and agrees to  proceed with tapering or weaning of her infant to the bottle.   I will set to see her back in four weeks to see how she is doing,  recheck a blood pressure, review her volume status.  Again she was  encouraged to continue as she is doing, watch salt and excess fluid.     Pricilla Riffle, MD, [facility]  Electronically Signed    PVR/MedQ  DD: 08/12/2007  DT: 08/12/2007  Job #: [id number]   cc:   Maxie Better, M.D.

## 2011-02-01 NOTE — Op Note (Signed)
NAMETALLEY, CASCO                 ACCOUNT NO.:  [id number]   MEDICAL RECORD NO.:  [id number]          PATIENT TYPE:  INP   LOCATION:  9302                          [facility]ILITY:  [facility]   PHYSICIAN:  Maxie Better, M.D.DATE OF BIRTH:  Apr 01, 1971   DATE OF PROCEDURE:  07/13/2007  DATE OF DISCHARGE:                               OPERATIVE REPORT   PREOPERATIVE DIAGNOSES:  1. Fetal macrosomia.  2. Class A2 gestational diabetes.  3. Intrauterine gestation at 36+ weeks, desires sterilization,      biophysical profile of 4/8.   PROCEDURE:  Primary cesarean section, Sharl Ma hysterotomy, modified Pomeroy  tubal ligation.   POSTOPERATIVE DIAGNOSES:  1. Fetal macrosomia.  2. Class A2 gestational diabetes.  3. Desires sterilization, biophysical profile 4/8, intrauterine      gestation at 36+ weeks.   ANESTHESIA:  Spinal.   SURGEON:  Maxie Better, M.D.   ASSISTANT:  Gerri Spore B. Earlene Plater, M.D.   INDICATIONS FOR PROCEDURE:  40 year old gravida 5, para 3-0-1-3 married  black female with class A2 gestational diabetes on glyburide now at 36+  weeks who was found on ultrasound to have evidence of estimated fetal  weight of 9 pounds with associated oligohydramnios and biophysical  profile of 4/8.  The patient also desires permanent sterilization.  Given the findings of fetal macrosomia and the poor biophysical profile,  decision was made to proceed with delivery.  Surgical risks were  reviewed with the patient.  Consent was signed.  The patient was  transferred to the operating room.   PROCEDURE:  Under adequate spinal anesthesia, the patient was placed in  the supine position with a left lateral tilt.  She was sterilely prepped  and draped in usual fashion.  An indwelling Foley catheter was sterilely  placed.  Marcaine 0.25% was injected along the planned Pfannenstiel skin  incision site.   A Pfannenstiel skin incision was then made and carried down to the  rectus fascia.  The rectus  fascia was opened transversely.  The rectus  fascia was then bluntly and sharply dissected off the rectus muscle in  superior and inferior fashion.  The rectus muscle was split in the  midline.  The parietal peritoneum was entered sharply and extended.  The  lower uterine segment was developed; however, there were prominent  vessels across the lower uterine segment.  The vesicouterine peritoneum  was opened transversely.  The bladder was then bluntly dissected off the  lower uterine segment and displaced inferiorly using a bladder  retractor.  A curvilinear low transverse uterine incision was then made  and extended with bandage scissors.  A large amount of bleeding was  noted at that time due to the prominent vessels.  Initial attempt at  delivery of the baby was unsuccessful from the vertex position.  The  baby subsequently had turned in a transverse back-down position.  After  manipulation, the vertex was then placed back in the longitudinal  position.  A Kiwi suction was then applied, and subsequent delivery of a  live female was accomplished.  The baby was bulb suctioned on the abdomen.  The cord was clamped and cut.  The baby was transferred to the awaiting  pediatrician with Apgars of 8 at 1 minutes and 9 at 5 minutes.  The  placenta was manually removed.  The uterine cavity was cleaned of  debris.  The uterine incision had no extension and was closed in two  layers, the first layer with 0 Monocryl running locked stitch, the  second layer was imbricated using 0 Monocryl sutures.  Bleeding was  noted on the right side.  A figure-of-eight suture was then placed.  Another figure-of-eight was placed on the left.   Attention was then turned to the tubes.  Both fallopian tubes were  identified down to the fimbriated ends.  Both ovaries appeared normal.  The midportion of both fallopian tubes was grasped with a Babcock.  The  underlying mesosalpinx was opened with cautery.  The proximal and  distal  portion of the tubes were tied with 0 chromic sutures proximally and  distally x2, and the intervening segment of tube was removed on both  sides.   At that point, attention was then turned back to the uterine incision.  It was then noted that there was some bleeding on the left angle.  A  figure-of-eight suture was then placed.  Subsequent pumping active  bleeding was then noted at which time the uterus was carefully  exteriorized, and an O'Leary suture had to be placed on the left in  order to facilitate hemostasis.  The abdomen was then copiously  irrigated and suctioned of debris.  The uterus was then carefully  returned back to the abdomen.  Both tubal segments were identified and  were still intact, with the sutures in place.  The incision had evidence  of  good hemostasis, and at that point the decision was then made to  close the abdomen.  The parietal peritoneum was not closed.  The rectus  fascia was closed with 0 Vicryl x2.  The subcutaneous area was irrigated  and small bleeders cauterized.  Interrupted 2-0 plain sutures was then  placed.  The skin was approximated using Ethicon staples.   SPECIMENS:  Placenta not sent to pathology.  Portion of the right and  left fallopian tubes sent to pathology.   ESTIMATED BLOOD LOSS:  800 mL.   INTRAOPERATIVE FLUID:  2500 mL crystalloid.   URINE OUTPUT:  20 mL clear yellow urine.   COUNTS:  Sponge and instrument counts x2 were correct.   COMPLICATION:  None.   INFANT DATA:  The weight of the baby was 7 pounds 12 ounces.   The patient tolerated the procedure well and was transferred to the  recovery room in stable condition.      Maxie Better, M.D.  Electronically Signed     [place]/MEDQ  D:  07/13/2007  T:  07/15/2007  Job:  [id number]

## 2011-02-01 NOTE — Assessment & Plan Note (Signed)
[facility]                            CARDIOLOGY OFFICE NOTE   Danielle Burns, Danielle Burns                          MRN:          [id number]  DATE:10/12/2007                            DOB:          [birth year]    IDENTIFICATION:  This is a phone conversation with Danielle Burns.   HISTORY:  A 40 year old woman with peripartum cardiomyopathy.  I saw her  back in December.   I had Coreg to her regimen 3.125 b.i.d.  She says since that time she  says she feels like at night her arms are aching.  She feels like she is  not getting enough circulation to them.   I have asked her to stop the Coreg and then call back in a couple weeks  and let us know how she is feeling.   Note today she denies any dizziness, no shortness of breath.     Pricilla Riffle, MD, [facility]  Electronically Signed    PVR/MedQ  DD: 10/12/2007  DT: 10/12/2007  Job #: [id number]

## 2011-02-01 NOTE — Discharge Summary (Signed)
Danielle Burns, HEFEL                 ACCOUNT NO.:  [id number]   MEDICAL RECORD NO.:  [id number]          PATIENT TYPE:  INP   LOCATION:  9374                          [facility]   PHYSICIAN:  Lenoard Aden, M.D.DATE OF BIRTH:  Nov 20, 1970   DATE OF ADMISSION:  07/23/2007  DATE OF DISCHARGE:  07/25/2007                               DISCHARGE SUMMARY   The patient was admitted postpartum status with shortness of breath and  bilateral pulmonary edema with cardiomegaly on July 23, 2007. She was  seen by cardiology, treated with aggressive diuresis and monitored in  the ICU. She did well.  Condition was improved. Upon further followup,  she was discharged to home on hospital day #3 with a diagnosis of  peripartum cardiomyopathy.  Echocardiogram was done.   DISCHARGE MEDICATIONS:  Included Lasix and potassium.   She is to follow-up with cardiology within 2 weeks. Discharge teaching  was done.  She is to follow up in the OB-GYN office in 4-6 weeks.      Lenoard Aden, M.D.  Electronically Signed     RJT/MEDQ  D:  08/29/2007  T:  08/29/2007  Job:  [id number]

## 2011-02-01 NOTE — Assessment & Plan Note (Signed)
[facility]                            CARDIOLOGY OFFICE NOTE   MIKAYELA, DEATS                          MRN:          [id number]  DATE:08/31/2007                            DOB:          05-Mar-1971    IDENTIFICATION:  Ms. Steadman is a 40 year old with a history of  pericardium cardiomyopathy.  I last saw her back in November.   In the interval, she has done okay.  She says she is somewhat more short  of breath and has noted a little bit of swelling in her feet and hands.  Her diet, she says, is unchanged.   CURRENT MEDICATIONS:  1. Multivitamins.  2. Lasix 20 b.i.d. (the patient does not note much urine response with      this).  3. Potassium 20 mEq daily.  4. Iron pills.   PHYSICAL EXAM:  The patient is in no distress.  Blood pressure 113/76, pulse 75, weight 205, up 4 pounds from last  visit.  LUNGS:  Relatively clear, though anteriorly, I hear a faint wheeze.  CARDIAC:  Regular rate and rhythm.  S1, S2.  No S3.  ABDOMEN:  Obese, benign.  EXTREMITIES:  Trace edema.   IMPRESSION:  Pericardium cardiomyopathy.  Volume status may be up a bit.  I would like to get another BMET and a BNP.  Her potassium was  borderline.  I am thinking I will switch her Lasix to 40 daily, possibly  40 alternating with 20 daily, but we will need to know what to do with  her potassium.  Based on this also I would like to add Coreg to her  regimen now that she is no longer breastfeeding.  I will be in touch  with her regarding this later today.   ADDENDUM:  Followup for a few weeks.     Pricilla Riffle, MD, [facility]  Electronically Signed    PVR/MedQ  DD: 08/31/2007  DT: 08/31/2007  Job #: [id number]

## 2011-02-04 NOTE — Op Note (Signed)
[facility]  Patient:    LEIGHANN, AMADON Visit Number: [id number] MRN: [id number]          Service Type: DSU Location: [facility] Attending Physician:  Rhina Brackett Dictated by:   Duke Salvia. Marcelle Overlie, M.D. Proc. Date: 12/31/01 Admit Date:  12/31/2001                             Operative Report  PREOPERATIVE DIAGNOSIS:       Incomplete abortion.  POSTOPERATIVE DIAGNOSIS:      Incomplete abortion.  PROCEDURE:                    Dilation and evacuation.  SURGEON:                      Duke Salvia. Marcelle Overlie, M.D.  ANESTHESIA:                   Sedation plus paracervical block.  PROCEDURE AND FINDINGS:       The patient was taken to the operating room. After a satisfactory level of sedation was obtained with the patients legs in stirrups, the perineum and vagina were prepped and draped in the usual manner for D&E.  The bladder was drained.  Examination under anesthesia was carried. The uterus was eight weeks in size and midposition.  Adnexa negative. Paracervical block was then created by infiltrated at 3 and 9 oclock submucosally 5-7 cc of 1% Xylocaine on either side after negative aspiration. The uterus was sounded to 8-9 cm and progressively dilated to a #29 Shawnie Pons.  A #8 curved suction curet was then used to curet a moderate amount of tissue. When no further tissue could be removed, a small blunt curet was used to explore the cavity, revealing it to be clean.  There was minimal bleeding. She tolerated this well and went to the recovery room in good condition. Dictated by:   Duke Salvia. Marcelle Overlie, M.D. Attending Physician:  Rhina Brackett DD:  12/31/01 TD:  12/31/01 Job: [id number]

## 2011-02-04 NOTE — H&P (Signed)
[facility]  Patient:    Danielle Burns, Danielle Burns Visit Number: [id number] MRN: [id number]          Service Type: DSU Location: [facility] Attending Physician:  Rhina Brackett Dictated by:   Duke Salvia. Marcelle Overlie, M.D. Admit Date:  12/31/2001                           History and Physical  CHIEF COMPLAINT:              Incomplete AB.  HISTORY OF PRESENT ILLNESS:   A 40 year old G1 P0 at eight weeks gestation presented with some spotting, had an ultrasound done at [facility] that showed an intrauterine pregnancy.  No FHR was noted.  She was followed by quantitative HCG analysis.  Initially on December 25, 2001 was 12,574; on April 11 was basically the same at 12,852.  Repeat ultrasound in our office showed a sac that had not changed from a prior ultrasound, no evidence of fetal pole. She presents now for a D&E.  PAST MEDICAL HISTORY:  ALLERGIES:                    None.  OPERATIONS:                   None.  REVIEW OF SYSTEMS:            Otherwise unremarkable.  PHYSICAL EXAMINATION:  VITAL SIGNS:                  Temperature 98.2, blood pressure 120/72.  HEENT:                        Unremarkable.  NECK:                         Supple without masses.  LUNGS:                        Clear.  CARDIOVASCULAR:               Regular rate and rhythm without murmurs, rubs, or gallops noted.  BREASTS:                      Not examined.  ABDOMEN:                      Soft, flat, and nontender.  PELVIC:                       Vagina and cervix normal.  Uterus was eight weeks size, mid position.  Adnexa negative.  IMPRESSION:                   Incomplete abortion.  PLAN:                         D&E.  This procedure including risks of bleeding, infection, other complications that may require open or additional surgery are reviewed with her, which she understands and accepts.  Will arrange to have her blood type checked also. Dictated by:   Duke Salvia. Marcelle Overlie,  M.D. Attending Physician:  Rhina Brackett DD:  12/31/01 TD:  12/31/01 Job: [id number]

## 2011-02-04 NOTE — Discharge Summary (Signed)
Danielle Burns, Danielle Burns                 ACCOUNT NO.:  [id number]   MEDICAL RECORD NO.:  [id number]          PATIENT TYPE:  INP   LOCATION:  9302                          FACILITY:  [facility]   PHYSICIAN:  Maxie Better, M.D.DATE OF BIRTH:  1971-01-27   DATE OF ADMISSION:  07/13/2007  DATE OF DISCHARGE:  07/16/2007                               DISCHARGE SUMMARY   ADMISSION DIAGNOSES:  1. Fetal macrosomia.  2. Class A2 gestational diabetes.  3. Intrauterine gestation at 36+ weeks.  4. Desires sterilization.  Biophysical profile 4/8.  5. Oligohydramnios   DISCHARGE DIAGNOSES:  1. Intrauterine gestation at 36+ weeks, delivered.  2. Fetal macrosomia.  3. Class A2 gestational diabetes.  4. Desires sterilization.  Biophysical profile 4/8.  5. Oligohydramnios.   PROCEDURE:  1. Primary cesarean section.  2. Modified Pomeroy tubal ligation.   HISTORY OF PRESENT ILLNESS:  A 40 year old gravida 5, para 3-0-1-3  female with class A2 gestational diabetes at 36+ weeks who was found to  have an estimated fetal weight of 9 pounds on ultrasound with associated  oligohydramnios and biophysical profile of 4/8.  The patient desired  permanent sterilization.  Given an unfavorable cervix and large for  gestational age baby, the decision was made to proceed with a primary  cesarean section  for  fetal macrosomia in a gestational diabetic  patient.   HOSPITAL COURSE:  The patient was admitted.  She was taken to the  operating room where she underwent a primary cesarean section and  modified Pomeroy tubal ligation.  She had a live female, Apgars of 8and 9.  Normal tubes and ovaries.  The patient had an uncomplicated  postoperative course.  CBC on postoperative day #1 showed a hemoglobin  of 7.7, hematocrit of 22.3, white count 11.4, platelet count 172,000.  She had a preop hemoglobin of 10, hematocrit 29.5.  By postoperative day  #3, the patient was ready for discharge.  She was tolerating a regular  diet.  Incision had no evidence of erythema, induration or exudate.  She  was asymptomatic from her anemia and therefore iron supplementation was  the only treatment.  She was deemed well enough to be discharged home.  Her incision had no evidence of infection.   DISPOSITION:  Home.   CONDITION:  Stable.   DISCHARGE MEDICATIONS:  1. Percocet 1-2 tablets every 4-6 ounces p.r.n. pain.  2. Chromagen Forte one p.o. b.i.d.  3. Prenatal vitamins one p.o. daily.  4. Motrin 600 mg one p.o. q.8h. p.r.n. pain.   FOLLOWUP:  Follow-up appointment at [facility] in 6 weeks.   DISCHARGE INSTRUCTIONS:  Per the postpartum booklet given.      Maxie Better, M.D.  Electronically Signed     [place]/MEDQ  D:  08/14/2007  T:  08/14/2007  Job:  [id number]

## 2011-02-04 NOTE — Discharge Summary (Signed)
Danielle Burns, Danielle Burns                             ACCOUNT NO.:  [id number]   MEDICAL RECORD NO.:  [id number]                   PATIENT TYPE:  INP   LOCATION:  9315                                 [facility]ACILITY:  [facility]   PHYSICIAN:  Lenoard Aden, M.D.             DATE OF BIRTH:  06/14/1971   DATE OF ADMISSION:  11/27/2003  DATE OF DISCHARGE:  12/13/2003                                 DISCHARGE SUMMARY   HOSPITAL COURSE:  The patient was admitted for third trimester bleeding on  November 27, 2003.  Course was uncomplicated.  Previously on magnesium  tocolysis with good response.  Contractions and bleeding had ceased at that  time.  Magnesium was discontinued.  Gestational diabetes was diagnosed based  on glucose testing previously.  Diet control was established with minimal  use of subcutaneous insulin therapy.  Endocrinology consult was obtained.  Third trimester bleeding had remained stable.  The patient remained  hospitalized on expectant management with good glucose control and good and  reassuring fetal heart rate surveillance.  The patient proceeded to go into  spontaneous labor at 33 and four-sevenths weeks.  She had an uncomplicated  vaginal delivery of a female on December 11, 2003.  Postoperative course was  uncomplicated.  Discharged to home on day #2.  Discharge teaching done.  Follow up in the office in 4-6 weeks.                                               Lenoard Aden, M.D.    RJT/MEDQ  D:  01/16/2004  T:  01/16/2004  Job:  [id number]

## 2011-03-09 ENCOUNTER — Telehealth: Payer: Self-pay | Admitting: Internal Medicine

## 2011-03-09 NOTE — Telephone Encounter (Signed)
Pt would like record of last tetanus shot mailed to her.

## 2011-03-09 NOTE — Telephone Encounter (Signed)
Immunization printed and mailed to patient

## 2011-03-15 ENCOUNTER — Telehealth: Payer: Self-pay | Admitting: *Deleted

## 2011-03-15 NOTE — Telephone Encounter (Signed)
Received message from pt stating she did not see documentation of tetanus inj. Received this year. Immunization history shows Tdap was given 12/14/10. Printout was mailed to pt on 03/09/11. Left message on machine for pt to return my call.

## 2011-03-18 NOTE — Telephone Encounter (Signed)
Pt returned my call and states she has received the documentation that she needed.

## 2011-05-02 ENCOUNTER — Ambulatory Visit: Payer: BC Managed Care – PPO | Admitting: Internal Medicine

## 2011-06-03 ENCOUNTER — Ambulatory Visit (INDEPENDENT_AMBULATORY_CARE_PROVIDER_SITE_OTHER): Payer: BC Managed Care – PPO | Admitting: Internal Medicine

## 2011-06-03 ENCOUNTER — Encounter: Payer: Self-pay | Admitting: Internal Medicine

## 2011-06-03 VITALS — BP 106/71 | HR 61 | Ht 63.0 in | Wt 189.0 lb

## 2011-06-03 DIAGNOSIS — R002 Palpitations: Secondary | ICD-10-CM

## 2011-06-03 DIAGNOSIS — I509 Heart failure, unspecified: Secondary | ICD-10-CM

## 2011-06-03 DIAGNOSIS — O903 Peripartum cardiomyopathy: Secondary | ICD-10-CM

## 2011-06-03 NOTE — Progress Notes (Addendum)
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  I saw her last in 2011. Since seen she has done OK.  She is not on any meds.  She denies CP.  Breathing is OK.  Does get occasional swelling in feet.  No palpitations.  No syncope.  No Known Allergies  Current Outpatient Prescriptions  Medication Sig Dispense Refill  . Multiple Vitamin (MULTIVITAMIN) tablet Take 1 tablet by mouth daily.          Past Medical History  Diagnosis Date  . Gestational diabetes   . Palpitations   . Peripartum cardiomyopathy     Past Surgical History  Procedure Date  . Tubal ligation     Family History  Problem Relation Age of Onset  . Diabetes Mother     type II  . Hypertension Mother   . Parkinsonism Father   . Cancer Neg Hx     negative for breast and colon    History   Social History  . Marital Status: Married    Spouse Name: N/A    Number of Children: 3  . Years of Education: N/A   Occupational History  . RN    Social History Main Topics  . Smoking status: Never Smoker   . Smokeless tobacco: Not on file  . Alcohol Use: No  . Drug Use:   . Sexually Active:    Other Topics Concern  . Not on file   Social History Narrative   Originally from [place].Accompanied by son - [place] 3 yrs2 sons and 2 daughters    Review of Systems:  All systems reviewed.  They are negative to the above problem except as previously stated.  Vital Signs: BP 106/71  Pulse 61  Ht 5\' 3"  (1.6 m)  Wt 189 lb (85.73 kg)  BMI 33.48 kg/m2  Physical Exam  Patient is in NAD HEENT:  Normocephalic, atraumatic. EOMI, PERRLA.  Neck: JVP is normal. No thyromegaly. No bruits.  Lungs: clear to auscultation. No rales no wheezes.  Heart: Regular rate and rhythm. Normal S1, S2. No S3.   No significant murmurs. PMI not displaced.  Abdomen:  Supple, nontender. Normal bowel sounds. No masses. No hepatomegaly.  Extremities:   Good distal pulses throughout. No lower extremity edema.    Musculoskeletal :moving all extremities.  Neuro:   alert and oriented x3.  CN II-XII grossly intact.  EKG:  NSR.  61 bpm.     Assessment and Plan:

## 2011-06-03 NOTE — Patient Instructions (Signed)
Your physician has requested that you have an echocardiogram. Echocardiography is a painless test that uses sound waves to create images of your heart. It provides your doctor with information about the size and shape of your heart and how well your heart's chambers and valves are working. This procedure takes approximately one hour. There are no restrictions for this procedure.   

## 2011-06-05 NOTE — Assessment & Plan Note (Signed)
Denies

## 2011-06-05 NOTE — Assessment & Plan Note (Signed)
Volume status looks good.  I would like her to have another echo to reevaluate her LV funciton.  She really should be on medical Rx.

## 2011-06-10 ENCOUNTER — Ambulatory Visit (HOSPITAL_COMMUNITY): Payer: BC Managed Care – PPO | Attending: Internal Medicine | Admitting: Radiology

## 2011-06-10 DIAGNOSIS — O903 Peripartum cardiomyopathy: Secondary | ICD-10-CM | POA: Insufficient documentation

## 2011-06-10 DIAGNOSIS — I369 Nonrheumatic tricuspid valve disorder, unspecified: Secondary | ICD-10-CM

## 2011-06-10 DIAGNOSIS — I079 Rheumatic tricuspid valve disease, unspecified: Secondary | ICD-10-CM | POA: Insufficient documentation

## 2011-06-10 DIAGNOSIS — I319 Disease of pericardium, unspecified: Secondary | ICD-10-CM | POA: Insufficient documentation

## 2011-06-10 DIAGNOSIS — I059 Rheumatic mitral valve disease, unspecified: Secondary | ICD-10-CM | POA: Insufficient documentation

## 2011-06-10 DIAGNOSIS — I509 Heart failure, unspecified: Secondary | ICD-10-CM

## 2011-06-28 LAB — CBC
Hemoglobin: 8.3 — ABNORMAL LOW
RBC: 2.81 — ABNORMAL LOW
RDW: 18.7 — ABNORMAL HIGH

## 2011-06-28 LAB — BASIC METABOLIC PANEL
BUN: 10
BUN: 7
Calcium: 8.4
Chloride: 106
Creatinine, Ser: 0.83
Creatinine, Ser: 0.83
Creatinine, Ser: 0.88
GFR calc Af Amer: >60
GFR calc Af Amer: >60
GFR calc non Af Amer: >60
GFR calc non Af Amer: >60
GFR calc non Af Amer: >60
Glucose, Bld: 80
Sodium: 134 — ABNORMAL LOW

## 2011-06-28 LAB — CK TOTAL AND CKMB (NOT AT ARMC)
CK, MB: 2
CK, MB: 2.3
CK, MB: 2.8
Relative Index: 1.3
Relative Index: 1.6
Relative Index: 1.8
Relative Index: 1.9
Total CK: 155
Total CK: 181 — ABNORMAL HIGH

## 2011-06-28 LAB — COMPREHENSIVE METABOLIC PANEL
ALT: 31
AST: 27
Alkaline Phosphatase: 77
CO2: 23
Glucose, Bld: 84
Potassium: 4.1
Sodium: 140
Total Protein: 5.9 — ABNORMAL LOW

## 2011-06-28 LAB — URINALYSIS, ROUTINE W REFLEX MICROSCOPIC
Ketones, ur: NEGATIVE
Nitrite: NEGATIVE
Protein, ur: NEGATIVE
Urobilinogen, UA: 0.2

## 2011-06-28 LAB — LACTATE DEHYDROGENASE: LDH: 299 — ABNORMAL HIGH

## 2011-06-29 LAB — CBC
Hemoglobin: 7.7 — CL
MCV: 84.4
Platelets: 192
RBC: 2.61 — ABNORMAL LOW
RBC: 3.5 — ABNORMAL LOW
WBC: 11.4 — ABNORMAL HIGH
WBC: 9.6

## 2011-06-29 LAB — RPR: RPR Ser Ql: NONREACTIVE

## 2011-06-29 LAB — ABO/RH: ABO/RH(D): B POS

## 2011-07-01 LAB — URINALYSIS, ROUTINE W REFLEX MICROSCOPIC
Glucose, UA: NEGATIVE
Hgb urine dipstick: NEGATIVE
Specific Gravity, Urine: <1.005 — ABNORMAL LOW
pH: 5.5

## 2011-12-15 ENCOUNTER — Encounter: Payer: Self-pay | Admitting: Internal Medicine

## 2011-12-15 ENCOUNTER — Ambulatory Visit (INDEPENDENT_AMBULATORY_CARE_PROVIDER_SITE_OTHER): Payer: BC Managed Care – PPO | Admitting: Internal Medicine

## 2011-12-15 VITALS — BP 102/72 | HR 76 | Temp 98.3°F | Resp 18 | Wt 194.0 lb

## 2011-12-15 DIAGNOSIS — J40 Bronchitis, not specified as acute or chronic: Secondary | ICD-10-CM

## 2011-12-15 DIAGNOSIS — R5381 Other malaise: Secondary | ICD-10-CM

## 2011-12-15 DIAGNOSIS — R5383 Other fatigue: Secondary | ICD-10-CM

## 2011-12-15 MED ORDER — LEVOFLOXACIN 500 MG PO TABS: 500.0000 mg | ORAL_TABLET | Freq: Every day | ORAL | Status: AC

## 2011-12-15 NOTE — Patient Instructions (Signed)
Please schedule fasting labs prior to your physical  Cbc, chem7, lft, lipid, tsh, ua with reflex micro -v70.0 Please schedule your physical according to your schedule

## 2011-12-18 DIAGNOSIS — J40 Bronchitis, not specified as acute or chronic: Secondary | ICD-10-CM | POA: Insufficient documentation

## 2011-12-18 DIAGNOSIS — R5383 Other fatigue: Secondary | ICD-10-CM | POA: Insufficient documentation

## 2011-12-18 NOTE — Assessment & Plan Note (Signed)
Likely associated with recent infxn. Schedule cpe with labs in near future

## 2011-12-18 NOTE — Progress Notes (Signed)
  Subjective:    Patient ID: Danielle Burns, female    DOB: Dec 30, 1970, 41 y.o.   MRN: [id number]  HPI Pt presents to clinic for evaluation of cough and fatigue. Notes one week h/o chest congestion and cough productive for brown sputum. No hemoptysis, fever or chills. Not taking medication for the problem. No alleviating or exacerbating factors. Notes also fatigue along the same time frame. H/o PP cardiomyopathy and notes nl lv fxn on last echo with cardiology. Wants to schedule cpe in near future. Sees gyn for pap smears.  Past Medical History  Diagnosis Date  . Gestational diabetes   . Palpitations   . Peripartum cardiomyopathy    Past Surgical History  Procedure Date  . Tubal ligation     reports that she has never smoked. She has never used smokeless tobacco. She reports that she does not drink alcohol. Her drug history not on file. family history includes Diabetes in her mother; Hypertension in her mother; and Parkinsonism in her father.  There is no history of Cancer. No Known Allergies   Review of Systems see hpi     Objective:   Physical Exam  Vitals reviewed. Constitutional: She appears well-developed and well-nourished. No distress.  HENT:  Head: Normocephalic and atraumatic.  Right Ear: Tympanic membrane, external ear and ear canal normal.  Left Ear: Tympanic membrane, external ear and ear canal normal.  Nose: Nose normal.  Mouth/Throat: Oropharynx is clear and moist. No oropharyngeal exudate.  Eyes: Conjunctivae are normal. Pupils are equal, round, and reactive to light. No scleral icterus.  Neck: Neck supple.  Cardiovascular: Normal rate, regular rhythm and normal heart sounds.  Exam reveals no gallop and no friction rub.   No murmur heard. Pulmonary/Chest: Effort normal and breath sounds normal. No respiratory distress. She has no wheezes. She has no rales.  Lymphadenopathy:    She has no cervical adenopathy.  Neurological: She is alert.  Skin: Skin is warm and dry.  She is not diaphoretic.  Psychiatric: She has a normal mood and affect.          Assessment & Plan:

## 2011-12-18 NOTE — Assessment & Plan Note (Signed)
Begin abx course. Followup if no improvement or worsening. 

## 2011-12-20 ENCOUNTER — Encounter: Payer: Self-pay | Admitting: Internal Medicine

## 2011-12-20 ENCOUNTER — Ambulatory Visit (INDEPENDENT_AMBULATORY_CARE_PROVIDER_SITE_OTHER): Payer: BC Managed Care – PPO | Admitting: Internal Medicine

## 2011-12-20 VITALS — BP 106/72 | HR 75 | Temp 98.3°F | Resp 16 | Ht 63.0 in | Wt 192.0 lb

## 2011-12-20 DIAGNOSIS — Z0000 Encounter for general adult medical examination without abnormal findings: Secondary | ICD-10-CM

## 2011-12-20 LAB — LIPID PANEL
LDL Cholesterol: 105 mg/dL — ABNORMAL HIGH (ref 0–99)
VLDL: 30 mg/dL (ref 0–40)

## 2011-12-20 LAB — HEPATIC FUNCTION PANEL
ALT: 13 U/L (ref 0–35)
AST: 16 U/L (ref 0–37)
Bilirubin, Direct: 0.1 mg/dL (ref 0.0–0.3)
Indirect Bilirubin: 0.3 mg/dL (ref 0.0–0.9)
Total Protein: 7.3 g/dL (ref 6.0–8.3)

## 2011-12-20 LAB — BASIC METABOLIC PANEL
BUN: 7 mg/dL (ref 6–23)
CO2: 25 mEq/L (ref 19–32)
Chloride: 108 mEq/L (ref 96–112)
Creat: 0.77 mg/dL (ref 0.50–1.10)
Potassium: 3.8 mEq/L (ref 3.5–5.3)

## 2011-12-20 NOTE — Progress Notes (Signed)
  Subjective:    Patient ID: Danielle Burns, female    DOB: 05-Aug-1971, 41 y.o.   MRN: [id number]  HPI Pt presents to clinic for annual exam. Recently seen for bronchitis and is improving sx's with abx. Had coinciding fatigue that has also improved. Sees gyn for pap smears.  Past Medical History  Diagnosis Date  . Gestational diabetes   . Palpitations   . Peripartum cardiomyopathy    Past Surgical History  Procedure Date  . Tubal ligation     reports that she has never smoked. She has never used smokeless tobacco. She reports that she does not drink alcohol. Her drug history not on file. family history includes Diabetes in her mother; Hypertension in her mother; and Parkinsonism in her father.  There is no history of Cancer. No Known Allergies   Review of Systems see hpi     Objective:   Physical Exam  Nursing note and vitals reviewed. Constitutional: She appears well-developed and well-nourished. No distress.  HENT:  Head: Normocephalic and atraumatic.  Right Ear: Tympanic membrane, external ear and ear canal normal.  Left Ear: Tympanic membrane, external ear and ear canal normal.  Nose: Nose normal.  Mouth/Throat: Oropharynx is clear and moist. No oropharyngeal exudate.  Eyes: Conjunctivae and EOM are normal. Pupils are equal, round, and reactive to light. No scleral icterus.  Neck: Neck supple. Carotid bruit is not present.  Cardiovascular: Normal rate, regular rhythm, normal heart sounds and intact distal pulses.  Exam reveals no gallop and no friction rub.   No murmur heard. Pulmonary/Chest: Effort normal and breath sounds normal. No respiratory distress. She has no wheezes. She has no rales.  Abdominal: Soft. Normal appearance and bowel sounds are normal. She exhibits no distension and no mass. There is no hepatosplenomegaly. There is no tenderness. There is no rebound and no guarding.  Lymphadenopathy:    She has no cervical adenopathy.  Neurological: She is alert.  Skin:  Skin is warm and dry. She is not diaphoretic.  Psychiatric: She has a normal mood and affect.          Assessment & Plan:

## 2011-12-20 NOTE — Patient Instructions (Signed)
Please schedule fasting physical labs prior to next year's physical 

## 2011-12-20 NOTE — Assessment & Plan Note (Signed)
Nl exam. Obtain cpe labs. Pap smears per gyn

## 2011-12-21 LAB — CBC WITH DIFFERENTIAL/PLATELET
Basophils Absolute: 0 10*3/uL (ref 0.0–0.1)
Eosinophils Relative: 2 % (ref 0–5)
HCT: 37.4 % (ref 36.0–46.0)
Lymphocytes Relative: 67 % — ABNORMAL HIGH (ref 12–46)
Lymphs Abs: 2.5 10*3/uL (ref 0.7–4.0)
MCV: 86.8 fL (ref 78.0–100.0)
Monocytes Absolute: 0.4 10*3/uL (ref 0.1–1.0)
Neutro Abs: 0.8 10*3/uL — ABNORMAL LOW (ref 1.7–7.7)
RBC: 4.31 MIL/uL (ref 3.87–5.11)
RDW: 13.8 % (ref 11.5–15.5)
WBC: 3.7 10*3/uL — ABNORMAL LOW (ref 4.0–10.5)

## 2011-12-21 LAB — URINALYSIS, ROUTINE W REFLEX MICROSCOPIC
Nitrite: NEGATIVE
Protein, ur: NEGATIVE mg/dL
Specific Gravity, Urine: 1.014 (ref 1.005–1.030)
Urobilinogen, UA: 0.2 mg/dL (ref 0.0–1.0)

## 2011-12-21 LAB — URINALYSIS, MICROSCOPIC ONLY: Crystals: NONE SEEN

## 2012-01-31 ENCOUNTER — Ambulatory Visit: Payer: BC Managed Care – PPO | Admitting: Internal Medicine

## 2012-07-04 ENCOUNTER — Ambulatory Visit (INDEPENDENT_AMBULATORY_CARE_PROVIDER_SITE_OTHER): Payer: BC Managed Care – PPO | Admitting: Internal Medicine

## 2012-07-04 ENCOUNTER — Encounter: Payer: Self-pay | Admitting: Internal Medicine

## 2012-07-04 VITALS — BP 110/82 | HR 70 | Temp 98.7°F | Resp 16 | Wt 191.5 lb

## 2012-07-04 DIAGNOSIS — H6690 Otitis media, unspecified, unspecified ear: Secondary | ICD-10-CM | POA: Insufficient documentation

## 2012-07-04 MED ORDER — MECLIZINE HCL 25 MG PO TABS: 25.0000 mg | ORAL_TABLET | Freq: Three times a day (TID) | ORAL | Status: DC | PRN

## 2012-07-04 MED ORDER — AMOXICILLIN-POT CLAVULANATE 875-125 MG PO TABS: 1.0000 | ORAL_TABLET | Freq: Two times a day (BID) | ORAL | Status: AC

## 2012-07-04 NOTE — Assessment & Plan Note (Signed)
Begin Augmentin. Attempt Antivert when necessary. Followup if no improvement or worsening.

## 2012-07-04 NOTE — Progress Notes (Signed)
  Subjective:    Patient ID: Danielle Burns, female    DOB: 1971/02/07, 41 y.o.   MRN: [id number]  HPI patient presents to clinic for evaluation of dizziness. Notes three-day history of dizziness or vertigo worse with head turning. No presyncope syncope or neurologic deficits including numbness tingling weakness or difficulty with speech. Did have one episode of nausea and vomiting. Overall symptoms have improved. Describes bilateral right greater than left ear pressure without drainage fever or chills. No other alleviating or exacerbating factors.  Past Medical History  Diagnosis Date  . Gestational diabetes   . Palpitations   . Peripartum cardiomyopathy    Past Surgical History  Procedure Date  . Tubal ligation     reports that she has never smoked. She has never used smokeless tobacco. She reports that she does not drink alcohol. Her drug history not on file. family history includes Diabetes in her mother; Hypertension in her mother; and Parkinsonism in her father.  There is no history of Cancer. No Known Allergies  Review of Systems see hpi     Objective:   Physical Exam  Nursing note and vitals reviewed. Constitutional: She appears well-developed and well-nourished. No distress.  HENT:  Head: Normocephalic and atraumatic.  Right Ear: External ear and ear canal normal.  Left Ear: Tympanic membrane, external ear and ear canal normal.  Nose: Nose normal.  Mouth/Throat: Oropharynx is clear and moist. No oropharyngeal exudate.       Right TM demonstrates erythema, retraction and dullness. No perforation  Eyes: Conjunctivae normal are normal. No scleral icterus.  Neurological: She is alert.  Skin: She is not diaphoretic.  Psychiatric: She has a normal mood and affect.          Assessment & Plan:

## 2012-12-20 ENCOUNTER — Encounter: Payer: BC Managed Care – PPO | Admitting: Family Medicine

## 2012-12-20 DIAGNOSIS — Z0289 Encounter for other administrative examinations: Secondary | ICD-10-CM

## 2014-07-30 ENCOUNTER — Encounter: Payer: Self-pay | Admitting: Medical

## 2014-07-30 ENCOUNTER — Ambulatory Visit (INDEPENDENT_AMBULATORY_CARE_PROVIDER_SITE_OTHER): Payer: BC Managed Care – PPO | Admitting: Medical

## 2014-07-30 VITALS — BP 120/77 | HR 119 | Temp 100.0°F | Ht 63.0 in | Wt 199.8 lb

## 2014-07-30 DIAGNOSIS — J029 Acute pharyngitis, unspecified: Secondary | ICD-10-CM

## 2014-07-30 DIAGNOSIS — R509 Fever, unspecified: Secondary | ICD-10-CM

## 2014-07-30 DIAGNOSIS — B349 Viral infection, unspecified: Secondary | ICD-10-CM

## 2014-07-30 DIAGNOSIS — N3001 Acute cystitis with hematuria: Secondary | ICD-10-CM

## 2014-07-30 LAB — POCT URINALYSIS DIPSTICK
BILIRUBIN UA: NEGATIVE
Glucose, UA: NEGATIVE
Ketones, UA: NEGATIVE
NITRITE UA: POSITIVE
Spec Grav, UA: 1.01
Urobilinogen, UA: 0.2
pH, UA: 6

## 2014-07-30 LAB — POCT INFLUENZA A/B
INFLUENZA A, POC: NEGATIVE
Influenza B, POC: NEGATIVE

## 2014-07-30 MED ORDER — AMOXICILLIN-POT CLAVULANATE 875-125 MG PO TABS: 1.0000 | ORAL_TABLET | Freq: Two times a day (BID) | ORAL | Status: DC

## 2014-07-30 MED ORDER — OSELTAMIVIR PHOSPHATE 75 MG PO CAPS: 75.0000 mg | ORAL_CAPSULE | Freq: Two times a day (BID) | ORAL | Status: DC

## 2014-07-30 NOTE — Patient Instructions (Addendum)
For your recent acute onset of viral syndrome symptoms verses strep throat, I decided to treat you with tamiflu and augmentin. Rapid testing can be falsely negative.  In addition your clinical presentation of uti in the past mimic some of viral syndrome symptoms. Your ua did look suspicious as well. We are sending your urine for culture. Augmentin is an antibiotic that can treat strep throat and urinary infections.  For your severe fatigue with fever, I am ordering labs today.  Call us on Friday before the weekend and update [place] how you are. If symptoms worsened or change over the weekend then ED evaluation.  You can take tylenol every 6 hours and ibuprofen 2 hours after each tylenol dose.

## 2014-07-30 NOTE — Progress Notes (Signed)
Pre visit review using our clinic review tool, if applicable. No additional management support is needed unless otherwise documented below in the visit note. 

## 2014-07-30 NOTE — Progress Notes (Signed)
Subjective:    Patient ID: Danielle Burns, female    DOB: November 09, 1970, 43 y.o.   MRN: [id number]  HPI   Pt in with fever, body aches all over that started yesterday. Some sore throat. Pt had flu vaccine 3 wks ago.   Pt in the past had one uti 3 times in the past and had chills and body aches with fever. Present no dysuria but pressure in the bladder.  Past Medical History  Diagnosis Date  . Gestational diabetes   . Palpitations   . Peripartum cardiomyopathy     History   Social History  . Marital Status: Married    Spouse Name: N/A    Number of Children: 3  . Years of Education: N/A   Occupational History  . RN    Social History Main Topics  . Smoking status: Never Smoker   . Smokeless tobacco: Never Used  . Alcohol Use: No  . Drug Use: Not on file  . Sexual Activity: Not on file   Other Topics Concern  . Not on file   Social History Narrative   Originally from [place].   Accompanied by son - Christean Leaf 3 yrs   2 sons and 2 daughters    Past Surgical History  Procedure Laterality Date  . Tubal ligation      Family History  Problem Relation Age of Onset  . Diabetes Mother     type II  . Hypertension Mother   . Parkinsonism Father   . Cancer Neg Hx     negative for breast and colon    No Known Allergies  Current Outpatient Prescriptions on File Prior to Visit  Medication Sig Dispense Refill  . meclizine (ANTIVERT) 25 MG tablet Take 1 tablet (25 mg total) by mouth 3 (three) times daily as needed. 30 tablet 0  . Multiple Vitamin (MULTIVITAMIN) tablet Take 1 tablet by mouth daily.       No current facility-administered medications on file prior to visit.    BP 120/77 mmHg  Pulse 119  Temp(Src) 100 F (37.8 C) (Oral)  Ht 5\' 3"  (1.6 m)  Wt 199 lb 12.8 oz (90.629 kg)  BMI 35.40 kg/m2  SpO2 97%  LMP 07/30/2014      Review of Systems  Constitutional: Positive for fever and fatigue. Negative for chills.  HENT: Positive for sore throat. Negative  for congestion, ear discharge, ear pain, facial swelling, hearing loss, mouth sores, nosebleeds, postnasal drip, rhinorrhea, sinus pressure, tinnitus and voice change.   Respiratory: Negative for choking, shortness of breath and wheezing.   Cardiovascular: Negative for chest pain and palpitations.  Gastrointestinal: Negative.   Genitourinary: Negative.        Only suprapubic pressure.  Musculoskeletal:       Diffuse myalgias all over.  Skin: Negative.   Neurological: Negative.   Hematological: Negative for adenopathy. Does not bruise/bleed easily.  Psychiatric/Behavioral: Negative.        Objective:   Physical Exam  Constitutional: She is oriented to person, place, and time. She appears well-developed and well-nourished.  Appears tired and sore. Wants to lay down instead of sitting. When she moves moans. But able to sit up and no acute distress.  HENT:  Head: Normocephalic and atraumatic.  Right Ear: External ear normal.  Left Ear: External ear normal.  Mouth/Throat: No oropharyngeal exudate.  Mild posterior pharnx redness  Eyes: Conjunctivae and EOM are normal. Pupils are equal, round, and reactive to light. Right eye exhibits  no discharge. Left eye exhibits no discharge. No scleral icterus.  Neck: Normal range of motion. Neck supple. No JVD present. No tracheal deviation present. No thyromegaly present.  Mild submandibular node swollen and mild tender.  Cardiovascular: Normal rate, regular rhythm and normal heart sounds.   Pulmonary/Chest: Effort normal and breath sounds normal. No respiratory distress. She has no wheezes. She has no rales. She exhibits no tenderness.  Abdominal: Soft. Bowel sounds are normal. She exhibits no distension and no mass. There is no tenderness. There is no rebound and no guarding.  Mild suprapubic tenderness.  Musculoskeletal:  No cva tenderness.  Lymphadenopathy:    She has no cervical adenopathy.  Neurological: She is alert and oriented to person,  place, and time.  Skin: She is not diaphoretic.  Psychiatric: She has a normal mood and affect. Her behavior is normal. Judgment and thought content normal.          Assessment & Plan:

## 2014-07-31 ENCOUNTER — Telehealth: Payer: Self-pay | Admitting: Medical

## 2014-07-31 DIAGNOSIS — N390 Urinary tract infection, site not specified: Secondary | ICD-10-CM | POA: Insufficient documentation

## 2014-07-31 DIAGNOSIS — B349 Viral infection, unspecified: Secondary | ICD-10-CM | POA: Insufficient documentation

## 2014-07-31 DIAGNOSIS — E876 Hypokalemia: Secondary | ICD-10-CM

## 2014-07-31 DIAGNOSIS — D649 Anemia, unspecified: Secondary | ICD-10-CM

## 2014-07-31 DIAGNOSIS — J029 Acute pharyngitis, unspecified: Secondary | ICD-10-CM | POA: Insufficient documentation

## 2014-07-31 LAB — COMPREHENSIVE METABOLIC PANEL
ALT: 15 U/L (ref 0–35)
AST: 21 U/L (ref 0–37)
Albumin: 3 g/dL — ABNORMAL LOW (ref 3.5–5.2)
Alkaline Phosphatase: 50 U/L (ref 39–117)
BILIRUBIN TOTAL: 0.5 mg/dL (ref 0.2–1.2)
BUN: 8 mg/dL (ref 6–23)
CALCIUM: 8.8 mg/dL (ref 8.4–10.5)
CHLORIDE: 108 meq/L (ref 96–112)
CO2: 17 meq/L — AB (ref 19–32)
CREATININE: 0.9 mg/dL (ref 0.4–1.2)
GFR: 85.51 mL/min (ref >60.00)
GLUCOSE: 132 mg/dL — AB (ref 70–99)
Potassium: 3.4 mEq/L — ABNORMAL LOW (ref 3.5–5.1)
Sodium: 137 mEq/L (ref 135–145)
TOTAL PROTEIN: 7.3 g/dL (ref 6.0–8.3)

## 2014-07-31 LAB — CBC
HEMATOCRIT: 32.2 % — AB (ref 36.0–46.0)
HEMOGLOBIN: 10.5 g/dL — AB (ref 12.0–15.0)
MCHC: 32.8 g/dL (ref 30.0–36.0)
MCV: 79.3 fl (ref 78.0–100.0)
Platelets: 278 10*3/uL (ref 150.0–400.0)
RBC: 4.05 Mil/uL (ref 3.87–5.11)
RDW: 15.6 % — AB (ref 11.5–15.5)
WBC: 10.6 10*3/uL — ABNORMAL HIGH (ref 4.0–10.5)

## 2014-07-31 NOTE — Assessment & Plan Note (Signed)
Augmentin. Rapid testing can be falsely negative. So did rx antibiotic.

## 2014-07-31 NOTE — Assessment & Plan Note (Signed)
Clinical presentation of uti in the past mimic some of viral syndrome symptoms. Your ua did look suspicious as well. We are sending your urine for culture. Augmentin is an antibiotic that can treat strep throat and urinary infections

## 2014-07-31 NOTE — Assessment & Plan Note (Signed)
Flu like vs strep. Decided to rx tamiflu.

## 2014-07-31 NOTE — Telephone Encounter (Signed)
Labs for anemia and low k.

## 2014-08-01 LAB — URINE CULTURE: Colony Count: >=100000

## 2014-08-07 ENCOUNTER — Other Ambulatory Visit (INDEPENDENT_AMBULATORY_CARE_PROVIDER_SITE_OTHER): Payer: BC Managed Care – PPO

## 2014-08-07 DIAGNOSIS — D649 Anemia, unspecified: Secondary | ICD-10-CM

## 2014-08-07 DIAGNOSIS — E876 Hypokalemia: Secondary | ICD-10-CM

## 2014-08-07 LAB — CBC WITH DIFFERENTIAL/PLATELET
Basophils Absolute: 0.1 10*3/uL (ref 0.0–0.1)
Basophils Relative: 1 % (ref 0.0–3.0)
EOS PCT: 2.4 % (ref 0.0–5.0)
Eosinophils Absolute: 0.2 10*3/uL (ref 0.0–0.7)
HEMATOCRIT: 33.5 % — AB (ref 36.0–46.0)
Hemoglobin: 10.8 g/dL — ABNORMAL LOW (ref 12.0–15.0)
LYMPHS ABS: 2.9 10*3/uL (ref 0.7–4.0)
Lymphocytes Relative: 39 % (ref 12.0–46.0)
MCHC: 32.3 g/dL (ref 30.0–36.0)
MCV: 80.2 fl (ref 78.0–100.0)
MONOS PCT: 6.4 % (ref 3.0–12.0)
Monocytes Absolute: 0.5 10*3/uL (ref 0.1–1.0)
Neutro Abs: 3.8 10*3/uL (ref 1.4–7.7)
Neutrophils Relative %: 51.2 % (ref 43.0–77.0)
Platelets: 376 10*3/uL (ref 150.0–400.0)
RBC: 4.18 Mil/uL (ref 3.87–5.11)
RDW: 15.7 % — ABNORMAL HIGH (ref 11.5–15.5)
WBC: 7.4 10*3/uL (ref 4.0–10.5)

## 2014-08-07 LAB — IRON AND TIBC
%SAT: 17 % — ABNORMAL LOW (ref 20–55)
Iron: 69 ug/dL (ref 42–145)
TIBC: 415 ug/dL (ref 250–470)
UIBC: 346 ug/dL (ref 125–400)

## 2014-08-07 LAB — COMPREHENSIVE METABOLIC PANEL
ALT: 29 U/L (ref 0–35)
AST: 20 U/L (ref 0–37)
Albumin: 3.5 g/dL (ref 3.5–5.2)
Alkaline Phosphatase: 54 U/L (ref 39–117)
BUN: 6 mg/dL (ref 6–23)
CALCIUM: 9.1 mg/dL (ref 8.4–10.5)
CHLORIDE: 107 meq/L (ref 96–112)
CO2: 25 meq/L (ref 19–32)
Creatinine, Ser: 0.8 mg/dL (ref 0.4–1.2)
GFR: 101.93 mL/min (ref >60.00)
Glucose, Bld: 69 mg/dL — ABNORMAL LOW (ref 70–99)
Potassium: 4.1 mEq/L (ref 3.5–5.1)
Sodium: 140 mEq/L (ref 135–145)
Total Bilirubin: 0.7 mg/dL (ref 0.2–1.2)
Total Protein: 7.6 g/dL (ref 6.0–8.3)

## 2014-08-07 LAB — FERRITIN: Ferritin: 18.5 ng/mL (ref 10.0–291.0)

## 2014-08-07 LAB — VITAMIN B12: Vitamin B-12: 954 pg/mL — ABNORMAL HIGH (ref 211–911)

## 2014-08-07 LAB — FOLATE: FOLATE: 12.1 ng/mL (ref >5.9)

## 2015-04-27 NOTE — Progress Notes (Signed)
Cardiology Office Note   Date:  04/28/2015   ID:  Danielle Burns, DOB 1971/04/27, MRN [id number]  PCP:  Jeb Levering, Philbert Riser, MD  Cardiologist:   Sharol Harness, MD   Chief Complaint  Patient presents with  . Shortness of Breath    on exertion  . Chest Pain    tightness/pressure on and off  . Fatigue    when working, she gets really tired  . Edema    legs feet and ankles bilateral      History of Present Illness: Danielle Burns is a 44 y.o. 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  No Kercheval has a history of peripartum cardiomyopathy that was diagnosed in 2012. At the time her ejection fraction was reportedly in the 30s. She subsequently recovered normal systolic function was dismissed from Dr. Tanna Furry clinic.  Over the last 2 months she has started to feel recurrent symptoms of heart failure. She has a reported a 20 pound weight gain as well as swelling in her hands and feet that is worse at the end of the day. The swelling improves by morning. She also notes an occasional heaviness in her chest that occurs sporadically. It does not happen with exertion and is not associated with chest pressure or lightheadedness. He noted exertional dyspnea and is unable to walk from her car to her office without feeling short of breath. She denies orthopnea or PND.  [name] reports occasional palpitations that occur approximately once every 3 months. It is associated with dizziness, lightheadedness, mild chest pressure and shortness of breath. The last for a few minutes and is alleviated with rest.  Patient does not exercise regularly. She is to be a member of a gym but ended this as she was not using it. Her diet reportedly could be improved. She does not think much about what she will be eating. She doesn't drink any sodas but does occasionally have sweet tea or lemonade.  Past Medical History  Diagnosis Date  . Gestational diabetes   .  Palpitations   . Peripartum cardiomyopathy     Past Surgical History  Procedure Laterality Date  . Tubal ligation       Current Outpatient Prescriptions  Medication Sig Dispense Refill  . Multiple Vitamin (MULTIVITAMIN) tablet Take 1 tablet by mouth daily.       No current facility-administered medications for this visit.    Allergies:   Review of patient's allergies indicates no known allergies.    Social History:  The patient  reports that she has never smoked. She has never used smokeless tobacco. She reports that she does not drink alcohol.   Family History:  The patient's family history includes Diabetes in her mother; Hypertension in her mother; Parkinsonism in her father. There is no history of Cancer.    ROS:  Please see the history of present illness.   Otherwise, review of systems are positive for none.   All other systems are reviewed and negative.    PHYSICAL EXAM: VS:  BP 102/64 mmHg  Pulse 75  Ht 5\' 3"  (1.6 m)  Wt 92.761 kg (204 lb 8 oz)  BMI 36.23 kg/m2 , BMI Body mass index is 36.23 kg/(m^2). GENERAL:  Well appearing HEENT:  Pupils equal round and reactive, fundi not visualized, oral mucosa unremarkable NECK:  JVP 2 cm above clavicle at 45 degrees, waveform within normal limits, carotid upstroke brisk and symmetric, no bruits, no thyromegaly LYMPHATICS:  No cervical adenopathy LUNGS:  Clear to auscultation bilaterally HEART:  RRR.  PMI not displaced or sustained,S1 and S2 within normal limits, no S3, no S4, no clicks, no rubs, no murmurs ABD:  Flat, positive bowel sounds normal in frequency in pitch, no bruits, no rebound, no guarding, no midline pulsatile mass, no hepatomegaly, no splenomegaly EXT:  2 plus pulses throughout, trace pedal edema, no cyanosis no clubbing SKIN:  No rashes no nodules NEURO:  Cranial nerves II through XII grossly intact, motor grossly intact throughout PSYCH:  Cognitively intact, oriented to person place and time    EKG:  EKG  is ordered today. The ekg ordered today demonstrates sinus rhythm at 75 bpm.  TTE 05/2011: EF 55-60%.    Recent Labs: 08/07/2014: ALT 29; BUN 6; Creatinine, Ser 0.8; Hemoglobin 10.8*; Platelets 376.0; Potassium 4.1; Sodium 140    Lipid Panel    Component Value Date/Time   CHOL 173 12/20/2011 0936   TRIG 151* 12/20/2011 0936   HDL 38* 12/20/2011 0936   CHOLHDL 4.6 12/20/2011 0936   VLDL 30 12/20/2011 0936   LDLCALC 105* 12/20/2011 0936      Wt Readings from Last 3 Encounters:  04/28/15 92.761 kg (204 lb 8 oz)  07/30/14 90.629 kg (199 lb 12.8 oz)  07/04/12 86.864 kg (191 lb 8 oz)     Other studies Reviewed: Additional studies/ records that were reviewed today include: n/a. Review of the above records demonstrates:  Please see elsewhere in the note.     ASSESSMENT AND PLAN:  # SOB/Edema: Ms. Haberman symptoms are consistent with heart failure.  She states there is no chance she could be pregnant at this time.  Clinically she is very mildly volume overloaded. We will start with an evaluation for recurrent heart failure. If that is unremarkable we will proceed to cardiac stress testing as she has exertional symptoms. We will wait on starting any pharmacologic therapy until we get the results of her echo. - TTE - BNP - TSH   3 month f/u  Current medicines are reviewed at length with the patient today.  The patient does not have concerns regarding medicines.  The following changes have been made:  no change  Labs/ tests ordered today include: TTE, BNP, TSH   No orders of the defined types were placed in this encounter.     Disposition:   FU with Dr. Jonelle Sidle C. [place] in 3 months    Signed, Sharol Harness, MD  04/28/2015 9:21 AM    [place]

## 2015-04-28 ENCOUNTER — Ambulatory Visit (INDEPENDENT_AMBULATORY_CARE_PROVIDER_SITE_OTHER): Payer: BLUE CROSS/BLUE SHIELD | Admitting: Cardiovascular Disease

## 2015-04-28 ENCOUNTER — Encounter: Payer: Self-pay | Admitting: Cardiovascular Disease

## 2015-04-28 VITALS — BP 102/64 | HR 75 | Ht 63.0 in | Wt 204.5 lb

## 2015-04-28 DIAGNOSIS — R609 Edema, unspecified: Secondary | ICD-10-CM

## 2015-04-28 DIAGNOSIS — R002 Palpitations: Secondary | ICD-10-CM | POA: Diagnosis not present

## 2015-04-28 DIAGNOSIS — R0609 Other forms of dyspnea: Secondary | ICD-10-CM | POA: Diagnosis not present

## 2015-04-28 LAB — TSH: TSH: 0.752 u[IU]/mL (ref 0.350–4.500)

## 2015-04-28 NOTE — Patient Instructions (Signed)
Your physician has requested that you have an echocardiogram. Echocardiography is a painless test that uses sound waves to create images of your heart. It provides your doctor with information about the size and shape of your heart and how well your heart's chambers and valves are working. This procedure takes approximately one hour. There are no restrictions for this procedure.  Labs-BNP,TSH,  WILL CONTACT YOU WITH RESULTS  Your physician wants you to follow-up in 3 months Dr Oval Linsey.  You will receive a reminder letter in the mail two months in advance. If you don't receive a letter, please call our office to schedule the follow-up appointment.

## 2015-04-29 LAB — BRAIN NATRIURETIC PEPTIDE: Brain Natriuretic Peptide: 10.2 pg/mL (ref 0.0–100.0)

## 2015-04-29 LAB — MAGNESIUM: MAGNESIUM: 1.9 mg/dL (ref 1.5–2.5)

## 2015-05-04 ENCOUNTER — Telehealth: Payer: Self-pay | Admitting: *Deleted

## 2015-05-04 DIAGNOSIS — R002 Palpitations: Secondary | ICD-10-CM

## 2015-05-04 DIAGNOSIS — R609 Edema, unspecified: Secondary | ICD-10-CM

## 2015-05-04 DIAGNOSIS — R5383 Other fatigue: Secondary | ICD-10-CM

## 2015-05-04 DIAGNOSIS — R0609 Other forms of dyspnea: Principal | ICD-10-CM

## 2015-05-04 NOTE — Telephone Encounter (Signed)
LEFT MESSAGE TO CALL BACK  IN REGARDS TO LAB RESULTS 

## 2015-05-04 NOTE — Telephone Encounter (Signed)
-----   Message from Skeet Latch, MD sent at 04/29/2015 11:35 PM EDT ----- Thyroid function and magnesium are normal.  BNP is normal indicating that Danielle Burns does not have heart failure.  Please set her up for a treadmill stress test.

## 2015-05-05 ENCOUNTER — Ambulatory Visit (HOSPITAL_COMMUNITY): Payer: BLUE CROSS/BLUE SHIELD | Attending: Cardiovascular Disease

## 2015-05-05 ENCOUNTER — Other Ambulatory Visit: Payer: Self-pay

## 2015-05-05 DIAGNOSIS — I071 Rheumatic tricuspid insufficiency: Secondary | ICD-10-CM | POA: Insufficient documentation

## 2015-05-05 DIAGNOSIS — I371 Nonrheumatic pulmonary valve insufficiency: Secondary | ICD-10-CM | POA: Diagnosis not present

## 2015-05-05 DIAGNOSIS — R609 Edema, unspecified: Secondary | ICD-10-CM

## 2015-05-05 DIAGNOSIS — I340 Nonrheumatic mitral (valve) insufficiency: Secondary | ICD-10-CM | POA: Diagnosis not present

## 2015-05-05 DIAGNOSIS — R0609 Other forms of dyspnea: Secondary | ICD-10-CM

## 2015-05-05 DIAGNOSIS — I313 Pericardial effusion (noninflammatory): Secondary | ICD-10-CM | POA: Diagnosis not present

## 2015-05-05 DIAGNOSIS — R002 Palpitations: Secondary | ICD-10-CM

## 2015-05-05 NOTE — Telephone Encounter (Signed)
Spoke to patient. Result given . Verbalized understaNDING Patient states she had echo today. She is aware.  Dr Oval Linsey would like for her to have GXT.  GXT ORDERED

## 2015-05-06 ENCOUNTER — Telehealth (HOSPITAL_COMMUNITY): Payer: Self-pay | Admitting: *Deleted

## 2015-05-06 ENCOUNTER — Encounter (HOSPITAL_COMMUNITY): Payer: Self-pay | Admitting: *Deleted

## 2015-05-07 ENCOUNTER — Ambulatory Visit (HOSPITAL_COMMUNITY)
Admission: RE | Admit: 2015-05-07 | Discharge: 2015-05-07 | Disposition: A | Discharge status: Home / Self Care | Payer: BLUE CROSS/BLUE SHIELD | Source: Ambulatory Visit | Attending: Cardiovascular Disease | Admitting: Cardiovascular Disease

## 2015-05-07 ENCOUNTER — Other Ambulatory Visit: Payer: Self-pay

## 2015-05-07 DIAGNOSIS — R002 Palpitations: Secondary | ICD-10-CM | POA: Diagnosis not present

## 2015-05-07 DIAGNOSIS — R0609 Other forms of dyspnea: Secondary | ICD-10-CM

## 2015-05-07 DIAGNOSIS — R5383 Other fatigue: Secondary | ICD-10-CM

## 2015-05-07 DIAGNOSIS — R609 Edema, unspecified: Secondary | ICD-10-CM | POA: Insufficient documentation

## 2015-05-07 LAB — EXERCISE TOLERANCE TEST
CSEPEW: 10.4 METS
CSEPHR: 97 %
CSEPPHR: 171 {beats}/min
Exercise duration (min): 8 min
Exercise duration (sec): 1 s
MPHR: 176 {beats}/min
RPE: 15
Rest HR: 100 {beats}/min

## 2015-05-07 MED ORDER — METOPROLOL TARTRATE 25 MG PO TABS: ORAL_TABLET | ORAL | Status: DC

## 2015-05-07 MED ORDER — LISINOPRIL 10 MG PO TABS: 10.0000 mg | ORAL_TABLET | Freq: Every day | ORAL | Status: DC

## 2015-05-30 ENCOUNTER — Ambulatory Visit (INDEPENDENT_AMBULATORY_CARE_PROVIDER_SITE_OTHER): Payer: BLUE CROSS/BLUE SHIELD | Admitting: Internal Medicine

## 2015-05-30 ENCOUNTER — Encounter: Payer: Self-pay | Admitting: Internal Medicine

## 2015-05-30 VITALS — BP 100/62 | HR 76 | Temp 98.6°F | Ht 63.0 in | Wt 205.0 lb

## 2015-05-30 DIAGNOSIS — B349 Viral infection, unspecified: Secondary | ICD-10-CM

## 2015-05-30 NOTE — Progress Notes (Signed)
Pre visit review using our clinic review tool, if applicable. No additional management support is needed unless otherwise documented below in the visit note. 

## 2015-05-30 NOTE — Assessment & Plan Note (Signed)
No evidence of bacterial infection Discussed symptomatic care OOW note till 9/12 Recheck if worsens next week

## 2015-05-30 NOTE — Progress Notes (Signed)
   Subjective:    Patient ID: Danielle Burns, female    DOB: [birth year], 44 y.o.   MRN: [id number]  HPI Here due to infection symptoms  Having aches/pains and sweats Aching all over Felt faint at work yesterday Started 3 days ago Pain in back of neck also  Some cough-- clear mucus Sometimes SOB--not really any change from baseline Has felt like she had fever--- up to 99.9 in day No sore throat or ear pain  Bought cough med-- tea and lozenges. 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 lisinopril (PRINIVIL,ZESTRIL) 10 MG tablet Take 1 tablet (10 mg total) by mouth daily. 30 tablet 6  . metoprolol tartrate (LOPRESSOR) 25 MG tablet Take 1/2 tablet twice a day 30 tablet 6  . Multiple Vitamin (MULTIVITAMIN) tablet Take 1 tablet by mouth daily.       No current facility-administered medications on file prior to visit.    No Known Allergies  Past Medical History  Diagnosis Date  . Gestational diabetes   . Palpitations   . Peripartum cardiomyopathy     Past Surgical History  Procedure Laterality Date  . Tubal ligation      Family History  Problem Relation Age of Onset  . Diabetes Mother     type II  . Hypertension Mother   . Parkinsonism Father   . Cancer Neg Hx     negative for breast and colon    Social History   Social History  . Marital Status: Married    Spouse Name: N/A  . Number of Children: 3  . Years of Education: N/A   Occupational History  . RN    Social History Main Topics  . Smoking status: Never Smoker   . Smokeless tobacco: Never Used  . Alcohol Use: No  . Drug Use: Not on file  . Sexual Activity: Not on file   Other Topics Concern  . Not on file   Social History Narrative   Originally from [place].   Accompanied by son - [place] 3  yrs   2 sons and 2 daughters   Review of Systems Some nausea--no vomiting Appetite fair No rash    Objective:   Physical Exam  Constitutional: She appears well-developed and well-nourished. No distress.  HENT:  Mouth/Throat: Oropharynx is clear and moist. No oropharyngeal exudate.  TMs normal No sinus tenderness Mild nasal inflammation  Neck: Normal range of motion. Neck supple.  Non tender anterior cervical nodes  Pulmonary/Chest: Effort normal and breath sounds normal. No respiratory distress. She has no wheezes. She has no rales.  Abdominal: Soft. There is no tenderness.  Musculoskeletal: She exhibits no edema.          Assessment & Plan:

## 2015-06-03 ENCOUNTER — Telehealth: Payer: Self-pay | Admitting: *Deleted

## 2015-06-03 NOTE — Telephone Encounter (Signed)
Unable to reach patient at time of Pre-Visit Call.  Left message for patient to return call when available.    

## 2015-06-04 ENCOUNTER — Ambulatory Visit (INDEPENDENT_AMBULATORY_CARE_PROVIDER_SITE_OTHER): Payer: BLUE CROSS/BLUE SHIELD | Admitting: Family Medicine

## 2015-06-04 ENCOUNTER — Encounter: Payer: Self-pay | Admitting: Family Medicine

## 2015-06-04 ENCOUNTER — Other Ambulatory Visit: Payer: Self-pay | Admitting: Family Medicine

## 2015-06-04 ENCOUNTER — Ambulatory Visit (HOSPITAL_BASED_OUTPATIENT_CLINIC_OR_DEPARTMENT_OTHER)
Admission: RE | Admit: 2015-06-04 | Discharge: 2015-06-04 | Disposition: A | Discharge status: Home / Self Care | Payer: BLUE CROSS/BLUE SHIELD | Source: Ambulatory Visit | Attending: Family Medicine | Admitting: Family Medicine

## 2015-06-04 VITALS — BP 100/68 | HR 77 | Temp 99.8°F | Ht 63.0 in | Wt 205.6 lb

## 2015-06-04 DIAGNOSIS — R918 Other nonspecific abnormal finding of lung field: Secondary | ICD-10-CM | POA: Insufficient documentation

## 2015-06-04 DIAGNOSIS — R509 Fever, unspecified: Secondary | ICD-10-CM | POA: Insufficient documentation

## 2015-06-04 DIAGNOSIS — R059 Cough, unspecified: Secondary | ICD-10-CM

## 2015-06-04 DIAGNOSIS — N390 Urinary tract infection, site not specified: Secondary | ICD-10-CM

## 2015-06-04 DIAGNOSIS — R350 Frequency of micturition: Secondary | ICD-10-CM

## 2015-06-04 DIAGNOSIS — Z0000 Encounter for general adult medical examination without abnormal findings: Secondary | ICD-10-CM

## 2015-06-04 DIAGNOSIS — R05 Cough: Secondary | ICD-10-CM

## 2015-06-04 DIAGNOSIS — R319 Hematuria, unspecified: Secondary | ICD-10-CM | POA: Diagnosis not present

## 2015-06-04 LAB — COMPREHENSIVE METABOLIC PANEL
ALT: 12 U/L (ref 0–35)
AST: 17 U/L (ref 0–37)
Albumin: 3.7 g/dL (ref 3.5–5.2)
Alkaline Phosphatase: 59 U/L (ref 39–117)
BUN: 8 mg/dL (ref 6–23)
CO2: 27 mEq/L (ref 19–32)
Calcium: 8.9 mg/dL (ref 8.4–10.5)
Chloride: 101 mEq/L (ref 96–112)
Creatinine, Ser: 0.88 mg/dL (ref 0.40–1.20)
GFR: 89.66 mL/min (ref >60.00)
Glucose, Bld: 76 mg/dL (ref 70–99)
Potassium: 3.8 mEq/L (ref 3.5–5.1)
Sodium: 135 mEq/L (ref 135–145)
Total Bilirubin: 0.5 mg/dL (ref 0.2–1.2)
Total Protein: 7.9 g/dL (ref 6.0–8.3)

## 2015-06-04 LAB — POCT URINALYSIS DIPSTICK
Bilirubin, UA: NEGATIVE
Glucose, UA: NEGATIVE
Ketones, UA: NEGATIVE
Nitrite, UA: NEGATIVE
Protein, UA: NEGATIVE
Spec Grav, UA: 1.02
Urobilinogen, UA: 2
pH, UA: 6

## 2015-06-04 LAB — LIPID PANEL
Cholesterol: 172 mg/dL (ref 0–200)
HDL: 40.5 mg/dL (ref >39.00)
LDL Cholesterol: 112 mg/dL — ABNORMAL HIGH (ref 0–99)
NONHDL: 131.15
Total CHOL/HDL Ratio: 4
Triglycerides: 96 mg/dL (ref 0.0–149.0)
VLDL: 19.2 mg/dL (ref 0.0–40.0)

## 2015-06-04 LAB — CBC WITH DIFFERENTIAL/PLATELET
BASOS ABS: 0.1 10*3/uL (ref 0.0–0.1)
BASOS PCT: 0.6 % (ref 0.0–3.0)
EOS PCT: 0.5 % (ref 0.0–5.0)
Eosinophils Absolute: 0.1 10*3/uL (ref 0.0–0.7)
HCT: 28.4 % — ABNORMAL LOW (ref 36.0–46.0)
Hemoglobin: 9 g/dL — ABNORMAL LOW (ref 12.0–15.0)
LYMPHS ABS: 2.1 10*3/uL (ref 0.7–4.0)
Lymphocytes Relative: 18.9 % (ref 12.0–46.0)
MCHC: 31.7 g/dL (ref 30.0–36.0)
MONOS PCT: 9.8 % (ref 3.0–12.0)
Monocytes Absolute: 1.1 10*3/uL — ABNORMAL HIGH (ref 0.1–1.0)
NEUTROS ABS: 7.8 10*3/uL — AB (ref 1.4–7.7)
NEUTROS PCT: 70.2 % (ref 43.0–77.0)
PLATELETS: 374 10*3/uL (ref 150.0–400.0)
RBC: 4.1 Mil/uL (ref 3.87–5.11)
RDW: 18.7 % — AB (ref 11.5–15.5)
WBC: 11.1 10*3/uL — ABNORMAL HIGH (ref 4.0–10.5)

## 2015-06-04 LAB — TSH: TSH: 0.61 u[IU]/mL (ref 0.35–4.50)

## 2015-06-04 LAB — POCT INFLUENZA A/B
INFLUENZA A, POC: NEGATIVE
INFLUENZA B, POC: NEGATIVE

## 2015-06-04 MED ORDER — CEFDINIR 300 MG PO CAPS: 300.0000 mg | ORAL_CAPSULE | Freq: Two times a day (BID) | ORAL | Status: DC

## 2015-06-04 MED ORDER — OSELTAMIVIR PHOSPHATE 75 MG PO CAPS: 75.0000 mg | ORAL_CAPSULE | Freq: Two times a day (BID) | ORAL | Status: DC

## 2015-06-04 NOTE — Patient Instructions (Signed)

## 2015-06-04 NOTE — Assessment & Plan Note (Signed)
Check labs ? Flu Tylenol/ nsaid prn

## 2015-06-04 NOTE — Progress Notes (Signed)
Pre visit review using our clinic review tool, if applicable. No additional management support is needed unless otherwise documented below in the visit note. 

## 2015-06-04 NOTE — Progress Notes (Signed)
Subjective:    Patient ID: Danielle Burns, female    DOB: [birth year], 44 y.o.   MRN: [id number]  Chief Complaint  Patient presents with  . Establish Care    AEX Fasting-- No pap  . Fever    chills and bodyaches for several months (patient keeps having random fever, bodyaches and chills)    HPI Patient is in today for fever / bodyaches x 3-4 days .   + cough that is sometimes productive.   Past Medical History  Diagnosis Date  . Gestational diabetes   . Palpitations   . Peripartum cardiomyopathy   . Recurrent UTI     Past Surgical History  Procedure Laterality Date  . Tubal ligation      Family History  Problem Relation Age of Onset  . Diabetes Mother     type II  . Hypertension Mother   . Parkinsonism Father   . Cancer Neg Hx     negative for breast and colon    Social History   Social History  . Marital Status: Married    Spouse Name: N/A  . Number of Children: 3  . Years of Education: N/A   Occupational History  . RN    Social History Main Topics  . Smoking status: Never Smoker   . Smokeless tobacco: Never Used  . Alcohol Use: No  . Drug Use: Not on file  . Sexual Activity: Not on file   Other Topics Concern  . Not on file   Social History Narrative   Originally from [place].   Accompanied by son - [place] 3 yrs   2 sons and 2 daughters    Outpatient Prescriptions Prior to Visit  Medication Sig Dispense Refill  . lisinopril (PRINIVIL,ZESTRIL) 10 MG tablet Take 1 tablet (10 mg total) by mouth daily. 30 tablet 6  . metoprolol tartrate (LOPRESSOR) 25 MG tablet Take 1/2 tablet twice a day 30 tablet 6  . Multiple Vitamin (MULTIVITAMIN) tablet Take 1 tablet by mouth daily.       No facility-administered medications prior to visit.    No Known Allergies  Review of Systems  Constitutional: Positive for fever, chills and malaise/fatigue. Negative for diaphoresis.  HENT: Positive for sore throat. Negative for congestion.   Eyes: Negative for  discharge.  Respiratory: Negative for shortness of breath.   Cardiovascular: Negative for chest pain, palpitations and leg swelling.  Gastrointestinal: Negative for nausea and abdominal pain.  Genitourinary: Negative for dysuria.  Musculoskeletal: Negative for falls.  Skin: Negative for rash.  Neurological: Negative for loss of consciousness, weakness and headaches.  Endo/Heme/Allergies: Negative for environmental allergies.  Psychiatric/Behavioral: Negative for depression. The patient is not nervous/anxious.        Objective:    Physical Exam  Constitutional: She is oriented to person, place, and time. She appears well-developed and well-nourished.  HENT:  Head: Normocephalic and atraumatic.  Eyes: Conjunctivae and EOM are normal.  Neck: Normal range of motion. Neck supple. No JVD present. Carotid bruit is not present. No thyromegaly present.  Cardiovascular: Normal rate, regular rhythm and normal heart sounds.   No murmur heard. Pulmonary/Chest: Effort normal and breath sounds normal. No respiratory distress. She has no wheezes. She has no rales. She exhibits no tenderness.  Musculoskeletal: She exhibits no edema.  Neurological: She is alert and oriented to person, place, and time.  Psychiatric: She has a normal mood and affect. Her behavior is normal.    BP 100/68 mmHg  Pulse  77  Temp(Src) 99.8 F (37.7 C) (Oral)  Ht _0  (1.6 m)  Wt 205 lb 9.6 oz (93.26 kg)  BMI 36.43 kg/m2  SpO2 98%  LMP 05/09/2015 Wt Readings from Last 3 Encounters:  06/04/15 205 lb 9.6 oz (93.26 kg)  05/30/15 205 lb (92.987 kg)  04/28/15 204 lb 8 oz (92.761 kg)     Lab Results  Component Value Date   WBC 11.1* 06/04/2015   HGB 9.0* 06/04/2015   HCT 28.4* 06/04/2015   PLT 374.0 06/04/2015   GLUCOSE 76 06/04/2015   CHOL 172 06/04/2015   TRIG 96.0 06/04/2015   HDL 40.50 06/04/2015   LDLCALC 112* 06/04/2015   ALT 12 06/04/2015   AST 17 06/04/2015   NA 135 06/04/2015   K 3.8 06/04/2015    CL 101 06/04/2015   CREATININE 0.88 06/04/2015   BUN 8 06/04/2015   CO2 27 06/04/2015   TSH 0.61 06/04/2015   HGBA1C 5.7 01/07/2009    Lab Results  Component Value Date   TSH 0.61 06/04/2015   Lab Results  Component Value Date   WBC 11.1* 06/04/2015   HGB 9.0* 06/04/2015   HCT 28.4* 06/04/2015   MCV 69.2 Repeated and verified X2.* 06/04/2015   PLT 374.0 06/04/2015   Lab Results  Component Value Date   NA 135 06/04/2015   K 3.8 06/04/2015   CO2 27 06/04/2015   GLUCOSE 76 06/04/2015   BUN 8 06/04/2015   CREATININE 0.88 06/04/2015   BILITOT 0.5 06/04/2015   ALKPHOS 59 06/04/2015   AST 17 06/04/2015   ALT 12 06/04/2015   PROT 7.9 06/04/2015   ALBUMIN 3.7 06/04/2015   CALCIUM 8.9 06/04/2015   GFR 89.66 06/04/2015   Lab Results  Component Value Date   CHOL 172 06/04/2015   Lab Results  Component Value Date   HDL 40.50 06/04/2015   Lab Results  Component Value Date   LDLCALC 112* 06/04/2015   Lab Results  Component Value Date   TRIG 96.0 06/04/2015   Lab Results  Component Value Date   CHOLHDL 4 06/04/2015   Lab Results  Component Value Date   HGBA1C 5.7 01/07/2009       Assessment & Plan:   Problem List Items Addressed This Visit    UTI (urinary tract infection)   Relevant Medications   cefdinir (OMNICEF) 300 MG capsule   oseltamivir (TAMIFLU) 75 MG capsule   Other Relevant Orders   Urine Culture   Preventative health care - Primary   Relevant Orders   Comp Met (CMET) (Completed)   CBC with Differential/Platelet (Completed)   Lipid panel (Completed)   TSH (Completed)   Fever of unknown origin    Check labs ? Flu Tylenol/ nsaid prn        Other Visit Diagnoses    Frequency of urination        Relevant Medications    cefdinir (OMNICEF) 300 MG capsule    Other Relevant Orders    POCT Urinalysis Dipstick (Completed)    Urine Culture    Fever, unknown origin        Relevant Medications    oseltamivir (TAMIFLU) 75 MG capsule    Other  Relevant Orders    Comp Met (CMET) (Completed)    CBC with Differential/Platelet (Completed)    Lipid panel (Completed)    TSH (Completed)    Culture, blood (single)    Culture, blood (single)    Rocky mtn spotted fvr ab, IgG-blood  B. burgdorfi antibodies    DG Chest 2 View (Completed)    POCT Influenza A/B (Completed)    Cough        Relevant Orders    DG Chest 2 View (Completed)       I am having Ms. Vanover start on cefdinir and oseltamivir. I am also having her maintain her multivitamin, lisinopril, and metoprolol tartrate.  Meds ordered this encounter  Medications  . cefdinir (OMNICEF) 300 MG capsule    Sig: Take 1 capsule (300 mg total) by mouth 2 (two) times daily.    Dispense:  10 capsule    Refill:  0  . oseltamivir (TAMIFLU) 75 MG capsule    Sig: Take 1 capsule (75 mg total) by mouth 2 (two) times daily.    Dispense:  10 capsule    Refill:  0     Garnet Koyanagi, DO

## 2015-06-05 LAB — ROCKY MTN SPOTTED FVR AB, IGG-BLOOD: RMSF IGG: 0.16 IV

## 2015-06-05 LAB — LYME AB/WESTERN BLOT REFLEX: B burgdorferi Ab IgG+IgM: 0.49 {ISR}

## 2015-06-06 LAB — URINE CULTURE

## 2015-06-09 ENCOUNTER — Encounter: Payer: BLUE CROSS/BLUE SHIELD | Admitting: Cardiovascular Disease

## 2015-06-09 NOTE — Progress Notes (Deleted)
Cardiology Office Note   Date:  06/09/2015   ID:  Danielle Burns, DOB [birth year], MRN [id number]  PCP:  Garnet Koyanagi, DO  Cardiologist:   Sharol Harness, MD   No chief complaint on file.     History of Present Illness: Danielle Burns is a 44 y.o. 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    fatigue and SOB.  No Chappelle has a history of peripartum cardiomyopathy that was diagnosed in 2012. At the time her ejection fraction was reportedly in the 30s. She subsequently recovered normal systolic function was dismissed from Dr. Tanna Furry clinic.  Over the last 2 months she has started to feel recurrent symptoms of heart failure. She has a reported a 20 pound weight gain as well as swelling in her hands and feet that is worse at the end of the day. The swelling improves by morning. She also notes an occasional heaviness in her chest that occurs sporadically. It does not happen with exertion and is not associated with chest pressure or lightheadedness. He noted exertional dyspnea and is unable to walk from her car to her office without feeling short of breath. She denies orthopnea or PND.  Danielle Burns reports occasional palpitations that occur approximately once every 3 months. It is associated with dizziness, lightheadedness, mild chest pressure and shortness of breath. The last for a few minutes and is alleviated with rest.  Patient does not exercise regularly. She is to be a member of a gym but ended this as she was not using it. Her diet reportedly could be improved. She does not think much about what she will be eating. She doesn't drink any sodas but does occasionally have sweet tea or lemonade.  Past Medical History  Diagnosis Date  . Gestational diabetes   . Palpitations   . Peripartum cardiomyopathy   . Recurrent UTI     Past Surgical History  Procedure Laterality Date  . Tubal ligation        Current Outpatient Prescriptions  Medication Sig Dispense Refill  . cefdinir (OMNICEF) 300 MG capsule Take 1 capsule (300 mg total) by mouth 2 (two) times daily. 10 capsule 0  . lisinopril (PRINIVIL,ZESTRIL) 10 MG tablet Take 1 tablet (10 mg total) by mouth daily. 30 tablet 6  . metoprolol tartrate (LOPRESSOR) 25 MG tablet Take 1/2 tablet twice a day 30 tablet 6  . Multiple Vitamin (MULTIVITAMIN) tablet Take 1 tablet by mouth daily.      Marland Kitchen oseltamivir (TAMIFLU) 75 MG capsule Take 1 capsule (75 mg total) by mouth 2 (two) times daily. 10 capsule 0   No current facility-administered medications for this visit.    Allergies:   Review of patient's allergies indicates no known allergies.    Social History:  The patient  reports that she has never smoked. She has never used smokeless tobacco. She reports that she does not drink alcohol.   Family History:  The patient's family history includes Diabetes in her mother; Hypertension in her mother; Parkinsonism in her father. There is no history of Cancer.    ROS:  Please see the history of present illness.   Otherwise, review of systems are positive for none.   All other systems are reviewed and negative.    PHYSICAL EXAM: VS:  LMP 05/09/2015 , BMI There is no weight on file to calculate BMI. GENERAL:  Well appearing HEENT:  Pupils equal round and reactive,  fundi not visualized, oral mucosa unremarkable NECK:  JVP 2 cm above clavicle at 45 degrees, waveform within normal limits, carotid upstroke brisk and symmetric, no bruits, no thyromegaly LYMPHATICS:  No cervical adenopathy LUNGS:  Clear to auscultation bilaterally HEART:  RRR.  PMI not displaced or sustained,S1 and S2 within normal limits, no S3, no S4, no clicks, no rubs, no murmurs ABD:  Flat, positive bowel sounds normal in frequency in pitch, no bruits, no rebound, no guarding, no midline pulsatile mass, no hepatomegaly, no splenomegaly EXT:  2 plus pulses throughout, trace pedal  edema, no cyanosis no clubbing SKIN:  No rashes no nodules NEURO:  Cranial nerves II through XII grossly intact, motor grossly intact throughout PSYCH:  Cognitively intact, oriented to person place and time    EKG:  EKG is ordered today. The ekg ordered today demonstrates sinus rhythm at 75 bpm.  TTE 05/2011: EF 55-60%.    Recent Labs: 04/28/2015: Magnesium 1.9 06/04/2015: ALT 12; BUN 8; Creatinine, Ser 0.88; Hemoglobin 9.0*; Platelets 374.0; Potassium 3.8; Sodium 135; TSH 0.61    Lipid Panel    Component Value Date/Time   CHOL 172 06/04/2015 1143   TRIG 96.0 06/04/2015 1143   HDL 40.50 06/04/2015 1143   CHOLHDL 4 06/04/2015 1143   VLDL 19.2 06/04/2015 1143   LDLCALC 112* 06/04/2015 1143      Wt Readings from Last 3 Encounters:  06/04/15 93.26 kg (205 lb 9.6 oz)  05/30/15 92.987 kg (205 lb)  04/28/15 92.761 kg (204 lb 8 oz)     Other studies Reviewed: Additional studies/ records that were reviewed today include: n/a. Review of the above records demonstrates:  Please see elsewhere in the note.     ASSESSMENT AND PLAN:  # Recurrent chronic systolic heart failure:   Ms. Burns symptoms are consistent with heart failure.  She states there is no chance she could be pregnant at this time.  Clinically she is very mildly volume overloaded. We will start with an evaluation for recurrent heart failure. If that is unremarkable we will proceed to cardiac stress testing as she has exertional symptoms. We will wait on starting any pharmacologic therapy until we get the results of her echo. - TTE - BNP - TSH   3 month f/u  Current medicines are reviewed at length with the patient today.  The patient does not have concerns regarding medicines.  The following changes have been made:  no change  Labs/ tests ordered today include: TTE, BNP, TSH   No orders of the defined types were placed in this encounter.     Disposition:   FU with Dr. Jonelle Sidle C.  in 3 months     Signed, Sharol Harness, MD  06/09/2015 8:27 AM    [place]

## 2015-06-09 NOTE — Progress Notes (Signed)
This encounter was created in error - please disregard.

## 2015-06-10 LAB — CULTURE, BLOOD (SINGLE)
ORGANISM ID, BACTERIA: NO GROWTH
Organism ID, Bacteria: NO GROWTH

## 2015-07-14 ENCOUNTER — Ambulatory Visit (INDEPENDENT_AMBULATORY_CARE_PROVIDER_SITE_OTHER): Payer: BLUE CROSS/BLUE SHIELD | Admitting: Internal Medicine

## 2015-07-14 ENCOUNTER — Ambulatory Visit (HOSPITAL_BASED_OUTPATIENT_CLINIC_OR_DEPARTMENT_OTHER)
Admission: RE | Admit: 2015-07-14 | Discharge: 2015-07-14 | Disposition: A | Discharge status: Home / Self Care | Payer: BLUE CROSS/BLUE SHIELD | Source: Ambulatory Visit | Attending: Internal Medicine | Admitting: Internal Medicine

## 2015-07-14 ENCOUNTER — Encounter: Payer: Self-pay | Admitting: Internal Medicine

## 2015-07-14 VITALS — BP 116/74 | HR 83 | Temp 98.5°F | Ht 63.0 in | Wt 212.4 lb

## 2015-07-14 DIAGNOSIS — R05 Cough: Secondary | ICD-10-CM

## 2015-07-14 DIAGNOSIS — R0989 Other specified symptoms and signs involving the circulatory and respiratory systems: Secondary | ICD-10-CM | POA: Insufficient documentation

## 2015-07-14 DIAGNOSIS — R059 Cough, unspecified: Secondary | ICD-10-CM

## 2015-07-14 MED ORDER — HYDROCODONE-HOMATROPINE 5-1.5 MG/5ML PO SYRP: 5.0000 mL | ORAL_SOLUTION | Freq: Every evening | ORAL | Status: DC | PRN

## 2015-07-14 MED ORDER — AZITHROMYCIN 250 MG PO TABS: ORAL_TABLET | ORAL | Status: DC

## 2015-07-14 NOTE — Progress Notes (Signed)
Subjective:    Patient ID: Danielle Burns, female    DOB: 1971/07/21, 44 y.o.   MRN: [id number]  DOS:  07/14/2015 Type of visit - description :acute visit Interval history: Was seen w/ a flu syndrome ~ 6 weeks ago, she improved with antivirals but 2 weeks later developed a persisting cough, occasional clear sputum at night. On  Mucinex OTC mild help. She does not smoke, no history of asthma or GERD.   Review of Systems  No fever chills No sinus pain, minimal congestion. Anterior chest pain with cough only. No short of breath. No lower extremity edema My nausea with episodes of cough only. No wheezing.  Past Medical History  Diagnosis Date  . Gestational diabetes   . Palpitations   . Peripartum cardiomyopathy   . Recurrent UTI     Past Surgical History  Procedure Laterality Date  . Tubal ligation      Social History   Social History  . Marital Status: Married    Spouse Name: N/A  . Number of Children: 3  . Years of Education: N/A   Occupational History  . RN    Social History Main Topics  . Smoking status: Never Smoker   . Smokeless tobacco: Never Used  . Alcohol Use: No  . Drug Use: Not on file  . Sexual Activity: Not on file   Other Topics Concern  . Not on file   Social History Narrative   Originally from [place].   Accompanied by son Christean Leaf 3 yrs   2 sons and 2 daughters        Medication List       This list is accurate as of: 07/14/15  6:42 PM.  Always use your most recent med list.               azithromycin 250 MG tablet  Commonly known as:  ZITHROMAX Z-PAK  2 tabs a day the first day, then 1 tab a day x 4 days     HYDROcodone-homatropine 5-1.5 MG/5ML syrup  Commonly known as:  HYCODAN  Take 5 mLs by mouth at bedtime as needed for cough.     lisinopril 10 MG tablet  Commonly known as:  PRINIVIL,ZESTRIL  Take 1 tablet (10 mg total) by mouth daily.     metoprolol tartrate 25 MG tablet  Commonly known as:  LOPRESSOR  Take  1/2 tablet twice a day     multivitamin tablet  Take 1 tablet by mouth daily.           Objective:   Physical Exam BP 116/74 mmHg  Pulse 83  Temp(Src) 98.5 F (36.9 C) (Oral)  Ht 5\' 3"  (1.6 m)  Wt 212 lb 6 oz (96.333 kg)  BMI 37.63 kg/m2  SpO2 96%  LMP 06/23/2015 (Approximate) General:   Well developed, well nourished . NAD, + episodes of cough noted.  HEENT:  Normocephalic . Face symmetric, atraumatic. TMs normal, nose is slightly congested, sinuses no TTP, throat no red Lungs:  CTA B Normal respiratory effort, no intercostal retractions, no accessory muscle use. Heart: RRR,  no murmur.  No pretibial edema bilaterally  Skin: Not pale. Not jaundice Neurologic:  alert & oriented X3.  Speech normal, gait appropriate for age and unassisted Psych--  Cognition and judgment appear intact.  Cooperative with normal attention span and concentration.  Behavior appropriate. No anxious or depressed appearing.      Assessment & Plan:   Cough: Persisting cough after  viral syndrome. Atypical infection? She also started ACE inhibitors few months ago. Plan: Chest x-ray, Zithromax, hydrocodone as needed ( side effects discussed) If not better the patient knows to call ---> likely will rec to d/c  ACE inhibitor and switch to ARBs.

## 2015-07-14 NOTE — Patient Instructions (Signed)
Get the XR at the first floor   -- Rest, fluids , tylenol  For cough: Take Mucinex DM twice a day as needed until better Take hydrocodone at night if the cough is severe, will cause drowsiness  If  nasal congestion Use OTC Nasocort or Flonase : 2 nasal sprays on each side of the nose daily until you feel better   Take the antibiotic as prescribed  (zithromax )  Call if not gradually better over the next  10 days. We may need to change lisinopril   Call anytime if the symptoms are severe

## 2015-07-14 NOTE — Progress Notes (Signed)
Pre visit review using our clinic review tool, if applicable. No additional management support is needed unless otherwise documented below in the visit note. 

## 2015-07-28 NOTE — Progress Notes (Signed)
Cardiology Office Note   Date:  07/29/2015   ID:  Danielle Burns, DOB 1970/12/27, MRN [id number]  PCP:  Garnet Koyanagi, DO  Cardiologist:   Sharol Harness, MD   Chief Complaint  Patient presents with  . Follow-up    Danielle lightheadedness or dizziness  . Chest Pain    pt states Danielle chest pain  . Shortness of Breath    Danielle SOB  . Edema    both feet    Patient ID: Danielle Burns is a 44 y.o. 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    Interval History 07/29/15: Danielle Burns echo showed that her LVEF is again reduced to 40-45%.  She was started on metoprolol and lisinopril.  She has been doing OK since her last visit.  She reports that she has been coughing for over a month. She's tried using over-the-counter medications which is not helping. The cough is nonproductive and is not associated with fevers or chills. She recently saw her PCP, who thought it may be due to lisinopril. She has not yet change this medication. She continues to have some lower extremity edema. She also notes significant shortness of breath with ambulation. She tries to walk for exercise excellently 30 minutes twice per week. She denies any orthopnea or PND. Her weight has been up approximately 12 pounds.   History of Present Illness 04/28/15:Danielle Burns has a history of peripartum cardiomyopathy that was diagnosed in 2012. At the time her ejection fraction was reportedly in the 30s. She subsequently recovered normal systolic function was dismissed from Dr. Tanna Furry clinic.  Over the last 2 months she has started to feel recurrent symptoms of heart failure. She has a reported a 20 pound weight gain as well as swelling in her hands and feet that is worse at the end of the day. The swelling improves by morning. She also notes an occasional heaviness in her chest that occurs sporadically. It does not happen with exertion and is not associated with chest  pressure or lightheadedness. He noted exertional dyspnea and is unable to walk from her car to her office without feeling short of breath. She denies orthopnea or PND.  Danielle Burns reports occasional palpitations that occur approximately once every 3 months. It is associated with dizziness, lightheadedness, mild chest pressure and shortness of breath. The last for a few minutes and is alleviated with rest.  Patient does not exercise regularly. She is to be a member of a gym but ended this as she was not using it. Her diet reportedly could be improved. She does not think much about what she will be eating. She doesn't drink any sodas but does occasionally have sweet tea or lemonade.  Past Medical History  Diagnosis Date  . Gestational diabetes   . Palpitations   . Peripartum cardiomyopathy   . Recurrent UTI   . Chronic systolic heart failure ([place]) 07/29/2015    Past Surgical History  Procedure Laterality Date  . Tubal ligation       Current Outpatient Prescriptions  Medication Sig Dispense Refill  . HYDROcodone-homatropine (HYCODAN) 5-1.5 MG/5ML syrup Take 5 mLs by mouth at bedtime as needed for cough. 120 mL 0  . Multiple Vitamin (MULTIVITAMIN) tablet Take 1 tablet by mouth daily.      . furosemide (LASIX) 20 MG tablet Take 1 tablet (20 mg total) by mouth daily. 30 tablet 11  . losartan (COZAAR) 25 MG tablet  Take 1 tablet (25 mg total) by mouth daily. 30 tablet 11  . metoprolol succinate (TOPROL-XL) 25 MG 24 hr tablet Take 1 tablet (25 mg total) by mouth daily. 30 tablet 11   Danielle current facility-administered medications for this visit.    Allergies:   Review of patient's allergies indicates Danielle known allergies.    Social History:  The patient  reports that she has never smoked. She has never used smokeless tobacco. She reports that she does not drink alcohol.   Family History:  The patient's family history includes Diabetes in her mother; Hypertension in her mother; Parkinsonism in  her father. There is Danielle history of Cancer.    ROS:  Please see the history of present illness.   Otherwise, review of systems are positive for none.   All other systems are reviewed and negative.    PHYSICAL EXAM: VS:  BP 116/74 mmHg  Pulse 94  Ht 5\' 3"  (1.6 m)  Wt 94.892 kg (209 lb 3.2 oz)  BMI 37.07 kg/m2  LMP 06/23/2015 (Approximate) , BMI Body mass index is 37.07 kg/(m^2). GENERAL:  Well appearing HEENT:  Pupils equal round and reactive, fundi not visualized, oral mucosa unremarkable NECK:  JVP 2 cm above clavicle at 45 degrees, waveform within normal limits, carotid upstroke brisk and symmetric, Danielle bruits, Danielle thyromegaly.  +HJR LYMPHATICS:  Danielle cervical adenopathy LUNGS:  Clear to auscultation bilaterally HEART:  RRR.  PMI not displaced or sustained,S1 and S2 within normal limits, Danielle S3, Danielle S4, Danielle clicks, Danielle rubs, Danielle murmurs ABD:  Flat, positive bowel sounds normal in frequency in pitch, Danielle bruits, Danielle rebound, Danielle guarding, Danielle midline pulsatile mass, Danielle hepatomegaly, Danielle splenomegaly EXT:  2 plus pulses throughout, 1+ pedal edema bilaterally, Danielle cyanosis Danielle clubbing SKIN:  Danielle rashes Danielle nodules NEURO:  Cranial nerves II through XII grossly intact, motor grossly intact throughout PSYCH:  Cognitively intact, oriented to person place and time    EKG:  EKG is ordered today. The ekg ordered today demonstrates sinus rhythm at 75 bpm.  TTE 05/2011: EF 55-60%.    TTE 05/05/15: Study Conclusions  - Left ventricle: The cavity size was normal. Wall thickness was normal. Systolic function was mildly reduced. The estimated ejection fraction was in the range of 45% to 50%. Diffuse hypokinesis. Left ventricular diastolic function parameters were normal. - Mitral valve: There was mild regurgitation. - Pericardium, extracardiac: A trivial pericardial effusion was identified.  Treadmill stress test 05/07/15:  Blood pressure demonstrated a blunted response to exercise.  There was  Danielle ST segment deviation noted during stress.  ETT with good exercise tolerance (8:01); Danielle CP; blunted BP response; Danielle ST changes; negative adequate ETT. Recent Labs: 04/28/2015: Magnesium 1.9 06/04/2015: ALT 12; BUN 8; Creatinine, Ser 0.88; Hemoglobin 9.0*; Platelets 374.0; Potassium 3.8; Sodium 135; TSH 0.61    Lipid Panel    Component Value Date/Time   CHOL 172 06/04/2015 1143   TRIG 96.0 06/04/2015 1143   HDL 40.50 06/04/2015 1143   CHOLHDL 4 06/04/2015 1143   VLDL 19.2 06/04/2015 1143   LDLCALC 112* 06/04/2015 1143      Wt Readings from Last 3 Encounters:  07/29/15 94.892 kg (209 lb 3.2 oz)  07/14/15 96.333 kg (212 lb 6 oz)  06/04/15 93.26 kg (205 lb 9.6 oz)     Other studies Reviewed: Additional studies/ records that were reviewed today include: n/a. Review of the above records demonstrates:  Please see elsewhere in the note.  ASSESSMENT AND PLAN:  # Recurrent chronic systolic heart failure:  Danielle Burns is mildly volume overloaded today and has NYHA class III symptoms.  We will switch her lisinopril to losartan 25 mg daily due to cough.  We will also consolidate to metoprolol succinate 25 mg daily.  She will start furosemide 20 mg po daily.  Check BMP today an repeat in 1 week.  Consider spironolactone.   Current medicines are reviewed at length with the patient today.  The patient does not have concerns regarding medicines.  The following changes have been made:    Labs/ tests ordered today include:  BMP    Orders Placed This Encounter  Procedures  . Basic metabolic panel  . Basic metabolic panel     Disposition:   FU with Dr. Jonelle Sidle C. [place] in 1 months    Signed, Sharol Harness, MD  07/29/2015 10:55 PM    [place]

## 2015-07-29 ENCOUNTER — Encounter: Payer: Self-pay | Admitting: Cardiovascular Disease

## 2015-07-29 ENCOUNTER — Ambulatory Visit (INDEPENDENT_AMBULATORY_CARE_PROVIDER_SITE_OTHER): Payer: BLUE CROSS/BLUE SHIELD | Admitting: Cardiovascular Disease

## 2015-07-29 VITALS — BP 116/74 | HR 94 | Ht 63.0 in | Wt 209.2 lb

## 2015-07-29 DIAGNOSIS — Z79899 Other long term (current) drug therapy: Secondary | ICD-10-CM | POA: Diagnosis not present

## 2015-07-29 DIAGNOSIS — R600 Localized edema: Secondary | ICD-10-CM

## 2015-07-29 DIAGNOSIS — I5022 Chronic systolic (congestive) heart failure: Secondary | ICD-10-CM

## 2015-07-29 HISTORY — DX: Chronic systolic (congestive) heart failure: I50.22

## 2015-07-29 MED ORDER — METOPROLOL SUCCINATE ER 25 MG PO TB24: 25.0000 mg | ORAL_TABLET | Freq: Every day | ORAL | Status: DC

## 2015-07-29 MED ORDER — LOSARTAN POTASSIUM 25 MG PO TABS: 25.0000 mg | ORAL_TABLET | Freq: Every day | ORAL | Status: DC

## 2015-07-29 MED ORDER — FUROSEMIDE 20 MG PO TABS: 20.0000 mg | ORAL_TABLET | Freq: Every day | ORAL | Status: DC

## 2015-07-29 NOTE — Patient Instructions (Signed)
Your physician recommends that you schedule a follow-up appointment in 1 month with DR Oval Linsey.   STOP LISINOPRIL,METOPROLOL TARTRATE    START LASIX ( FUROSEMIDE) 20 MG 1 TABLET BY MOUTH DAILY.  METOPROLOL ER ( SUCC) 25 MG  1 TABLET BY MOUTH DAILY  Losartan 25 mg one tablet by mouth daily  Lab today  BMP  AND  ONE WEEK- THURSDAY 08/06/15  If you need a refill on your cardiac medications before your next appointment, please call your pharmacy.

## 2015-07-30 ENCOUNTER — Ambulatory Visit: Payer: BLUE CROSS/BLUE SHIELD | Admitting: Cardiovascular Disease

## 2015-07-30 LAB — BASIC METABOLIC PANEL
BUN: 9 mg/dL (ref 7–25)
CHLORIDE: 106 mmol/L (ref 98–110)
CO2: 23 mmol/L (ref 20–31)
CREATININE: 0.8 mg/dL (ref 0.50–1.10)
Calcium: 8.9 mg/dL (ref 8.6–10.2)
GLUCOSE: 82 mg/dL (ref 65–99)
Potassium: 4.1 mmol/L (ref 3.5–5.3)
SODIUM: 138 mmol/L (ref 135–146)

## 2015-08-05 ENCOUNTER — Telehealth: Payer: Self-pay | Admitting: *Deleted

## 2015-08-05 NOTE — Telephone Encounter (Signed)
LEFT MESSAGE TO CAL BACK 

## 2015-08-05 NOTE — Telephone Encounter (Signed)
-----   Message from Skeet Latch, MD sent at 08/05/2015  8:46 AM EST ----- Normal kidney function and electrolytes.  Awaiting this week's results.

## 2015-08-06 ENCOUNTER — Telehealth: Payer: Self-pay | Admitting: Cardiovascular Disease

## 2015-08-06 NOTE — Telephone Encounter (Signed)
Spoke to patient. labs Result given . Verbalized understanding

## 2015-08-06 NOTE — Telephone Encounter (Signed)
Returning your call. °

## 2015-08-28 ENCOUNTER — Encounter: Payer: BLUE CROSS/BLUE SHIELD | Admitting: Internal Medicine

## 2015-09-03 ENCOUNTER — Ambulatory Visit (INDEPENDENT_AMBULATORY_CARE_PROVIDER_SITE_OTHER): Payer: BLUE CROSS/BLUE SHIELD | Admitting: Cardiovascular Disease

## 2015-09-03 ENCOUNTER — Encounter: Payer: Self-pay | Admitting: Cardiovascular Disease

## 2015-09-03 VITALS — BP 118/76 | HR 91 | Ht 63.0 in | Wt 212.5 lb

## 2015-09-03 DIAGNOSIS — I5022 Chronic systolic (congestive) heart failure: Secondary | ICD-10-CM | POA: Diagnosis not present

## 2015-09-03 DIAGNOSIS — Z79899 Other long term (current) drug therapy: Secondary | ICD-10-CM

## 2015-09-03 DIAGNOSIS — R0602 Shortness of breath: Secondary | ICD-10-CM | POA: Diagnosis not present

## 2015-09-03 MED ORDER — SPIRONOLACTONE 25 MG PO TABS: 12.5000 mg | ORAL_TABLET | Freq: Every day | ORAL | Status: DC

## 2015-09-03 NOTE — Progress Notes (Signed)
Cardiology Office Note   Date:  09/03/2015   ID:  Danielle Burns, DOB 12/15/1970, MRN [id number]  PCP:  Garnet Koyanagi, DO  Cardiologist:   Sharol Harness, MD   Chief Complaint  Patient presents with  . Follow-up    1 mo//pt c/o SOB on exertion and swelling in bilateral feet    Patient ID: Danielle Burns is a 44 y.o. 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    Interval History 09/03/15: At her last appointment lisinopril was switched to losartan due to cough.  He Danielle longer has a cough since stopping lisinopril. However, she continues to note exertional dyspnea.She denies orthopnea or PND.  She denies chest pain, lightheadedness, dizziness, or palpitations.  She weighs herself regularly and her weight is typically between 206 and 212 lb.  She tries to limit her intake of fluids but does not monitor salt intake.  She does note occasional LE edema.  Danielle Burns has been stressed lately due to the recent death of her father.  Her family is in [place] and she has not seen him in years.  She is planning a trip home soon.   Interval History 07/29/15: Danielle Burns echo showed that her LVEF is again reduced to 40-45%.  She was started on metoprolol and lisinopril.  She has been doing OK since her last visit.  She reports that she has been coughing for over a month. She's tried using over-the-counter medications which is not helping. The cough is nonproductive and is not associated with fevers or chills. She recently saw her PCP, who thought it may be due to lisinopril. She has not yet change this medication. She continues to have some lower extremity edema. She also notes significant shortness of breath with ambulation. She tries to walk for exercise excellently 30 minutes twice per week. She denies any orthopnea or PND. Her weight has been up approximately 12 pounds.   History of Present Illness 04/28/15:Danielle Burns has a  history of peripartum cardiomyopathy that was diagnosed in 2012. At the time her ejection fraction was reportedly in the 30s. She subsequently recovered normal systolic function was dismissed from Dr. Tanna Furry clinic.  Over the last 2 months she has started to feel recurrent symptoms of heart failure. She has a reported a 20 pound weight gain as well as swelling in her hands and feet that is worse at the end of the day. The swelling improves by morning. She also notes an occasional heaviness in her chest that occurs sporadically. It does not happen with exertion and is not associated with chest pressure or lightheadedness. He noted exertional dyspnea and is unable to walk from her car to her office without feeling short of breath. She denies orthopnea or PND.  Danielle Burns reports occasional palpitations that occur approximately once every 3 months. It is associated with dizziness, lightheadedness, mild chest pressure and shortness of breath. The last for a few minutes and is alleviated with rest.  Patient does not exercise regularly. She is to be a member of a gym but ended this as she was not using it. Her diet reportedly could be improved. She does not think much about what she will be eating. She doesn't drink any sodas but does occasionally have sweet tea or lemonade.  Past Medical History  Diagnosis Date  . Gestational diabetes   . Palpitations   . Peripartum cardiomyopathy   . Recurrent UTI   .  Chronic systolic heart failure ([place]) 07/29/2015    Past Surgical History  Procedure Laterality Date  . Tubal ligation       Current Outpatient Prescriptions  Medication Sig Dispense Refill  . furosemide (LASIX) 20 MG tablet Take 1 tablet (20 mg total) by mouth daily. 30 tablet 11  . HYDROcodone-homatropine (HYCODAN) 5-1.5 MG/5ML syrup Take 5 mLs by mouth at bedtime as needed for cough. 120 mL 0  . losartan (COZAAR) 25 MG tablet Take 1 tablet (25 mg total) by mouth daily. 30 tablet 11  .  metoprolol succinate (TOPROL-XL) 25 MG 24 hr tablet Take 1 tablet (25 mg total) by mouth daily. 30 tablet 11  . Multiple Vitamin (MULTIVITAMIN) tablet Take 1 tablet by mouth daily.      Marland Kitchen spironolactone (ALDACTONE) 25 MG tablet Take 0.5 tablets (12.5 mg total) by mouth daily. 45 tablet 3   Danielle current facility-administered medications for this visit.    Allergies:   Review of patient's allergies indicates Danielle known allergies.    Social History:  The patient  reports that she has never smoked. She has never used smokeless tobacco. She reports that she does not drink alcohol.   Family History:  The patient's family history includes Diabetes in her mother; Hypertension in her mother; Parkinsonism in her father. There is Danielle history of Cancer.    ROS:  Please see the history of present illness.   Otherwise, review of systems are positive for none.   All other systems are reviewed and negative.    PHYSICAL EXAM: VS:  BP 118/76 mmHg  Pulse 91  Ht 5\' 3"  (1.6 m)  Wt 96.389 kg (212 lb 8 oz)  BMI 37.65 kg/m2 , BMI Body mass index is 37.65 kg/(m^2). GENERAL:  Well appearing HEENT:  Pupils equal round and reactive, fundi not visualized, oral mucosa unremarkable NECK:  JVP 1 cm above clavicle at 45 degrees, waveform within normal limits, carotid upstroke brisk and symmetric, Danielle bruits, Danielle thyromegaly.  +HJR LYMPHATICS:  Danielle cervical adenopathy LUNGS:  Clear to auscultation bilaterally HEART:  RRR.  PMI not displaced or sustained,S1 and S2 within normal limits, Danielle S3, Danielle S4, Danielle clicks, Danielle rubs, Danielle murmurs ABD:  Flat, positive bowel sounds normal in frequency in pitch, Danielle bruits, Danielle rebound, Danielle guarding, Danielle midline pulsatile mass, Danielle hepatomegaly, Danielle splenomegaly EXT:  2 plus pulses throughout, trace edema bilaterally, Danielle cyanosis Danielle clubbing SKIN:  Danielle rashes Danielle nodules NEURO:  Cranial nerves II through XII grossly intact, motor grossly intact throughout PSYCH:  Cognitively intact, oriented to person  place and time    EKG:  EKG is not ordered today.  TTE 05/2011: EF 55-60%.    TTE 05/05/15: Study Conclusions  - Left ventricle: The cavity size was normal. Wall thickness was normal. Systolic function was mildly reduced. The estimated ejection fraction was in the range of 45% to 50%. Diffuse hypokinesis. Left ventricular diastolic function parameters were normal. - Mitral valve: There was mild regurgitation. - Pericardium, extracardiac: A trivial pericardial effusion was identified.  Treadmill stress test 05/07/15:  Blood pressure demonstrated a blunted response to exercise.  There was Danielle ST segment deviation noted during stress.  ETT with good exercise tolerance (8:01); Danielle CP; blunted BP response; Danielle ST changes; negative adequate ETT. Recent Labs: 04/28/2015: Magnesium 1.9 06/04/2015: ALT 12; Hemoglobin 9.0*; Platelets 374.0; TSH 0.61 07/29/2015: BUN 9; Creat 0.80; Potassium 4.1; Sodium 138    Lipid Panel    Component Value Date/Time  CHOL 172 06/04/2015 1143   TRIG 96.0 06/04/2015 1143   HDL 40.50 06/04/2015 1143   CHOLHDL 4 06/04/2015 1143   VLDL 19.2 06/04/2015 1143   LDLCALC 112* 06/04/2015 1143      Wt Readings from Last 3 Encounters:  09/03/15 96.389 kg (212 lb 8 oz)  07/29/15 94.892 kg (209 lb 3.2 oz)  07/14/15 96.333 kg (212 lb 6 oz)     Other studies Reviewed: Additional studies/ records that were reviewed today include: n/a. Review of the above records demonstrates:  Please see elsewhere in the note.     ASSESSMENT AND PLAN:  # Recurrent chronic systolic heart failure:  Ms. Burns volume status is improved today since starting lasix.  However, she continues to have exertional dyspnea.  I'm not comfortable increasing her diuretic at this time, as her BP is low-normal and she appears near euvolemic. - Check BNP and BMP - Continue losartan and metoprolol succcinate - Start spironolactone.  Repeat BMP in 1 week.   Current medicines are  reviewed at length with the patient today.  The patient does not have concerns regarding medicines.  The following changes have been made:    Labs/ tests ordered today include:  BMP    Orders Placed This Encounter  Procedures  . Basic metabolic panel  . Brain natriuretic peptide  . Basic metabolic panel     Disposition:   FU with Dr. Jonelle Sidle C. [place] in 3 months    Signed, Sharol Harness, MD  09/03/2015 3:34 PM    [place]

## 2015-09-03 NOTE — Patient Instructions (Signed)
Labs today BMP, BNP  START MEDICATIONS SPIROLACTONE 12.5 MG ( 1/2 TABLET OF 25 MG ) DAILY.  IN 1 WEEK AFTER STARTING SPIROLACTONE - GO TO LAB FOR MORE LAB WORK

## 2015-09-04 LAB — BASIC METABOLIC PANEL
BUN: 8 mg/dL (ref 7–25)
CALCIUM: 8.9 mg/dL (ref 8.6–10.2)
CO2: 23 mmol/L (ref 20–31)
Chloride: 105 mmol/L (ref 98–110)
Creat: 0.75 mg/dL (ref 0.50–1.10)
Glucose, Bld: 132 mg/dL — ABNORMAL HIGH (ref 65–99)
Potassium: 4.2 mmol/L (ref 3.5–5.3)
SODIUM: 138 mmol/L (ref 135–146)

## 2015-09-04 LAB — BRAIN NATRIURETIC PEPTIDE: BRAIN NATRIURETIC PEPTIDE: 15.1 pg/mL (ref 0.0–100.0)

## 2015-09-08 ENCOUNTER — Telehealth: Payer: Self-pay | Admitting: *Deleted

## 2015-09-08 NOTE — Telephone Encounter (Signed)
Left message to call aback

## 2015-09-08 NOTE — Telephone Encounter (Signed)
-----   Message from Skeet Latch, MD sent at 09/08/2015 12:23 AM EST ----- Normal kidney function, electrolytes and BNP. Normal BNP level means that her heart failure is well-controlled.  Do not increase lasix.

## 2015-09-08 NOTE — Telephone Encounter (Signed)
Spoke to patient. Result given . Verbalized understanding Do not increase lasix.

## 2015-12-03 ENCOUNTER — Ambulatory Visit: Payer: BLUE CROSS/BLUE SHIELD | Admitting: Cardiovascular Disease

## 2015-12-16 ENCOUNTER — Encounter: Payer: BLUE CROSS/BLUE SHIELD | Admitting: Medical

## 2015-12-16 ENCOUNTER — Telehealth: Payer: Self-pay | Admitting: Medical

## 2015-12-16 NOTE — Progress Notes (Signed)
This encounter was created in error - please disregard.

## 2015-12-17 ENCOUNTER — Encounter: Payer: Self-pay | Admitting: Medical

## 2015-12-17 NOTE — Telephone Encounter (Signed)
Marked to charge and mailing no show letter °

## 2015-12-17 NOTE — Telephone Encounter (Signed)
Pt was no show 12/16/15 10:30am for acute appt, pt has not rescheduled, last no show in 2014, charge or no charge?

## 2015-12-17 NOTE — Telephone Encounter (Signed)
Charge per pcp.

## 2015-12-21 NOTE — Progress Notes (Signed)
===   Cardiology Office Note   Date:  12/22/2015   ID:  Danielle Burns, DOB 1971/04/02, MRN [id number]  PCP:  Ann Held, DO  Cardiologist:   Sharol Harness, MD   Chief Complaint  Patient presents with  . systolic heart failure    patient denies cp, sob, le edema, or claudication    Patient ID: Danielle Burns is a 45 y.o. 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    Interval History 12/22/15: At her last appointment Danielle Burns was started on spironolactone.  Her BMP remained stable and BNP was 15.Overall she has been feeling well. She had an episode of syncope while visiting family in [place]. She was walking up a hill he area in very hot weather. She felt short of breath and sat down. She asked her family for some water and was feeling lightheaded. The next thing she knew she passed out. She reports that she was out for a couple minutes. There is Danielle preceding chest pain or palpitations. She has not had any recurrent symptoms since that time. Her volume has been well controlled. Her weight has been stable. She uses Lasix 3-4 times per week when she notices swelling in her legs or that her weight is up. She denies any lower extremity edema, orthopnea, or PND. Her weight ranges between 209 and 213.  Danielle Burns recently joined a gym. She exercises twice per week for 45 minutes to an hour each time. She does mostly group exercises. She denies chest pain with exercise but does have shortness of breath.   Interval History 09/03/15: At her last appointment lisinopril was switched to losartan due to cough.  He Danielle longer has a cough since stopping lisinopril. However, she continues to note exertional dyspnea.She denies orthopnea or PND.  She denies chest pain, lightheadedness, dizziness, or palpitations.  She weighs herself regularly and her weight is typically between 206 and 212 lb.  She tries to limit her  intake of fluids but does not monitor salt intake.  She does note occasional LE edema.  Danielle Burns has been stressed lately due to the recent death of her father.  Her family is in [place] and she has not seen him in years.  She is planning a trip home soon.   Interval History 07/29/15: Danielle Burns echo showed that her LVEF is again reduced to 40-45%.  She was started on metoprolol and lisinopril.  She has been doing OK since her last visit.  She reports that she has been coughing for over a month. She's tried using over-the-counter medications which is not helping. The cough is nonproductive and is not associated with fevers or chills. She recently saw her PCP, who thought it may be due to lisinopril. She has not yet changed this medication. She continues to have some lower extremity edema. She also notes significant shortness of breath with ambulation. She tries to walk for exercise excellently 30 minutes twice per week. She denies any orthopnea or PND. Her weight has been up approximately 12 pounds.   History of Present Illness 04/28/15:Danielle Burns has a history of peripartum cardiomyopathy that was diagnosed in 2012. At the time her ejection fraction was reportedly in the 30s. She subsequently recovered normal systolic function was dismissed from Dr. Tanna Furry clinic.  Over the last 2 months she has started to feel recurrent symptoms of heart failure. She has a reported a 20  pound weight gain as well as swelling in her hands and feet that is worse at the end of the day. The swelling improves by morning. She also notes an occasional heaviness in her chest that occurs sporadically. It does not happen with exertion and is not associated with chest pressure or lightheadedness. He noted exertional dyspnea and is unable to walk from her car to her office without feeling short of breath. She denies orthopnea or PND.  Danielle Burns reports occasional palpitations that occur approximately once every 3 months. It is  associated with dizziness, lightheadedness, mild chest pressure and shortness of breath. The last for a few minutes and is alleviated with rest.  Patient does not exercise regularly. She is to be a member of a gym but ended this as she was not using it. Her diet reportedly could be improved. She does not think much about what she will be eating. She doesn't drink any sodas but does occasionally have sweet tea or lemonade.  Past Medical History  Diagnosis Date  . Gestational diabetes   . Palpitations   . Peripartum cardiomyopathy   . Recurrent UTI   . Chronic systolic heart failure ([place]) 07/29/2015    Past Surgical History  Procedure Laterality Date  . Tubal ligation       Current Outpatient Prescriptions  Medication Sig Dispense Refill  . furosemide (LASIX) 20 MG tablet Take 1 tablet (20 mg total) by mouth daily. 30 tablet 11  . HYDROcodone-homatropine (HYCODAN) 5-1.5 MG/5ML syrup Take 5 mLs by mouth at bedtime as needed for cough. 120 mL 0  . losartan (COZAAR) 25 MG tablet Take 1 tablet (25 mg total) by mouth daily. 30 tablet 11  . metoprolol succinate (TOPROL-XL) 25 MG 24 hr tablet Take 1 tablet (25 mg total) by mouth daily. 30 tablet 11  . Multiple Vitamin (MULTIVITAMIN) tablet Take 1 tablet by mouth daily.      Marland Kitchen spironolactone (ALDACTONE) 25 MG tablet Take 0.5 tablets (12.5 mg total) by mouth daily. 45 tablet 3   Danielle current facility-administered medications for this visit.    Allergies:   Review of patient's allergies indicates Danielle known allergies.    Social History:  The patient  reports that she has never smoked. She has never used smokeless tobacco. She reports that she does not drink alcohol.   Family History:  The patient's family history includes Diabetes in her mother; Hypertension in her mother; Parkinsonism in her father. There is Danielle history of Cancer.    ROS:  Please see the history of present illness.   Otherwise, review of systems are positive for none.   All  other systems are reviewed and negative.    PHYSICAL EXAM: VS:  BP 110/60 mmHg  Pulse 88  Ht 5\' 3"  (1.6 m)  Wt 97.251 kg (214 lb 6.4 oz)  BMI 37.99 kg/m2 , BMI Body mass index is 37.99 kg/(m^2). GENERAL:  Well appearing HEENT:  Pupils equal round and reactive, fundi not visualized, oral mucosa unremarkable NECK:  JVP at clavicle at 45 degrees, waveform within normal limits, carotid upstroke brisk and symmetric, Danielle bruits, Danielle thyromegaly.  LYMPHATICS:  Danielle cervical adenopathy LUNGS:  Clear to auscultation bilaterally HEART:  RRR.  PMI not displaced or sustained,S1 and S2 within normal limits, Danielle S3, Danielle S4, Danielle clicks, Danielle rubs, Danielle murmurs ABD:  Flat, positive bowel sounds normal in frequency in pitch, Danielle bruits, Danielle rebound, Danielle guarding, Danielle midline pulsatile mass, Danielle hepatomegaly, Danielle splenomegaly EXT:  2 plus pulses throughout, trace edema bilaterally, Danielle cyanosis Danielle clubbing SKIN:  Danielle rashes Danielle nodules NEURO:  Cranial nerves II through XII grossly intact, motor grossly intact throughout PSYCH:  Cognitively intact, oriented to person place and time  EKG:  EKG is not ordered today.  TTE 05/2011: EF 55-60%.    TTE 05/05/15: Study Conclusions  - Left ventricle: The cavity size was normal. Wall thickness was normal. Systolic function was mildly reduced. The estimated ejection fraction was in the range of 45% to 50%. Diffuse hypokinesis. Left ventricular diastolic function parameters were normal. - Mitral valve: There was mild regurgitation. - Pericardium, extracardiac: A trivial pericardial effusion was identified.  Treadmill stress test 05/07/15:  Negative adequate ETT.  Recent Labs: 04/28/2015: Magnesium 1.9 06/04/2015: ALT 12; Hemoglobin 9.0*; Platelets 374.0; TSH 0.61 09/03/2015: BUN 8; Creat 0.75; Potassium 4.2; Sodium 138    Lipid Panel    Component Value Date/Time   CHOL 172 06/04/2015 1143   TRIG 96.0 06/04/2015 1143   HDL 40.50 06/04/2015 1143   CHOLHDL 4  06/04/2015 1143   VLDL 19.2 06/04/2015 1143   LDLCALC 112* 06/04/2015 1143      Wt Readings from Last 3 Encounters:  12/22/15 97.251 kg (214 lb 6.4 oz)  09/03/15 96.389 kg (212 lb 8 oz)  07/29/15 94.892 kg (209 lb 3.2 oz)     Other studies Reviewed: Additional studies/ records that were reviewed today include: n/a. Review of the above records demonstrates:  Please see elsewhere in the note.     ASSESSMENT AND PLAN:  # Recurrent chronic systolic heart failure:  Ms. Danielle Burns to be euvolemic on exam today. Her BNP was within normal limits when checked at the last appointment. Therefore, we will not make any changes to her medications at this time. Continue losartan, metoprolol, and spironolactone. She will also continue to take Lasix as needed. She was reminded to continue weighing herself daily and to limit her salt to 2 g and fluids to 2 L.  # Palpitations: Improved. Continue metoprolol.   Current medicines are reviewed at length with the patient today.  The patient does not have concerns regarding medicines.  The following changes have been made:    Labs/ tests ordered today include:  BMP    Danielle orders of the defined types were placed in this encounter.     Disposition:   FU with Dr. Jonelle Sidle C. Welling in 6 months    Signed, Sharol Harness, MD  12/22/2015 10:58 PM    [facility]

## 2015-12-22 ENCOUNTER — Encounter: Payer: Self-pay | Admitting: Cardiovascular Disease

## 2015-12-22 ENCOUNTER — Ambulatory Visit (INDEPENDENT_AMBULATORY_CARE_PROVIDER_SITE_OTHER): Payer: BLUE CROSS/BLUE SHIELD | Admitting: Cardiovascular Disease

## 2015-12-22 VITALS — BP 110/60 | HR 88 | Ht 63.0 in | Wt 214.4 lb

## 2015-12-22 DIAGNOSIS — I5042 Chronic combined systolic (congestive) and diastolic (congestive) heart failure: Secondary | ICD-10-CM | POA: Diagnosis not present

## 2015-12-22 NOTE — Patient Instructions (Signed)

## 2016-02-20 ENCOUNTER — Ambulatory Visit (INDEPENDENT_AMBULATORY_CARE_PROVIDER_SITE_OTHER): Payer: BLUE CROSS/BLUE SHIELD | Admitting: Family Medicine

## 2016-02-20 VITALS — BP 102/64 | HR 106 | Temp 100.2°F | Resp 16 | Ht 63.0 in | Wt 209.8 lb

## 2016-02-20 DIAGNOSIS — M609 Myositis, unspecified: Secondary | ICD-10-CM | POA: Diagnosis not present

## 2016-02-20 DIAGNOSIS — M791 Myalgia: Secondary | ICD-10-CM | POA: Diagnosis not present

## 2016-02-20 DIAGNOSIS — R509 Fever, unspecified: Secondary | ICD-10-CM | POA: Diagnosis not present

## 2016-02-20 DIAGNOSIS — IMO0001 Reserved for inherently not codable concepts without codable children: Secondary | ICD-10-CM

## 2016-02-20 LAB — POCT INFLUENZA A/B
INFLUENZA B, POC: NEGATIVE
Influenza A, POC: NEGATIVE

## 2016-02-20 MED ORDER — HYDROCODONE-HOMATROPINE 5-1.5 MG/5ML PO SYRP: 5.0000 mL | ORAL_SOLUTION | ORAL | Status: DC | PRN

## 2016-02-20 MED ORDER — ONDANSETRON 4 MG PO TBDP: 4.0000 mg | ORAL_TABLET | Freq: Three times a day (TID) | ORAL | Status: DC | PRN

## 2016-02-20 MED ORDER — ACETAMINOPHEN 500 MG PO TABS
1000.0000 mg | ORAL_TABLET | Freq: Once | ORAL | Status: AC
  Administered 2016-02-20: 1000 mg via ORAL

## 2016-02-20 MED ORDER — ACETAMINOPHEN 325 MG PO TABS: 1000.0000 mg | ORAL_TABLET | Freq: Once | ORAL | Status: DC

## 2016-02-20 MED ORDER — OSELTAMIVIR PHOSPHATE 75 MG PO CAPS: 75.0000 mg | ORAL_CAPSULE | Freq: Two times a day (BID) | ORAL | Status: DC

## 2016-02-20 NOTE — Progress Notes (Signed)
Subjective:  By signing my name below, I, Moises Blood, attest that this documentation has been prepared under the direction and in the presence of Delman Cheadle, MD. Electronically Signed: Moises Blood, Yoe. 02/20/2016 , 2:10 PM .  Patient was seen in Room 14 .   Patient ID: Danielle Burns, female    DOB: [birth year], 45 y.o.   MRN: [id number] Chief Complaint  Patient presents with  . soreness all over her body    x 2 days,    HPI Danielle Burns is a 45 y.o. female who presents to [facility] complaining of myalgia all over her body for the past 2 days. Patient states soreness from her left chest radiating to her back as well. She's been feeling feverish with chills. She also notes having nasal congestion and coughing. She's been having constipation; however, she has been fasting for the past 2 days for Ramadan, but eats at night. She's taken advil last night. She denies sore throat, nausea or diarrhea.   She received flu shot in Fall 2016.  She works as a Marine scientist at [employer].   She's taking lasix as needed. She had peripartum cardiomyopathy 7 years ago. She's followed by Dr. Oval Linsey for chronic systolic heart failure.   Past Medical History  Diagnosis Date  . Gestational diabetes   . Palpitations   . Peripartum cardiomyopathy   . Recurrent UTI   . Chronic systolic heart failure ([place]) 07/29/2015   Prior to Admission medications   Medication Sig Start Date End Date Taking? Authorizing Provider  carvedilol (COREG) 25 MG tablet Take 25 mg by mouth 2 (two) times daily with a meal.   Yes Historical Provider, MD  furosemide (LASIX) 20 MG tablet Take 1 tablet (20 mg total) by mouth daily. 07/29/15  Yes Skeet Latch, MD  metoprolol succinate (TOPROL-XL) 25 MG 24 hr tablet Take 1 tablet (25 mg total) by mouth daily. 07/29/15  Yes Skeet Latch, MD  spironolactone (ALDACTONE) 25 MG tablet Take 0.5 tablets (12.5 mg total) by mouth daily. 09/03/15  Yes Skeet Latch, MD    HYDROcodone-homatropine [facility]) 5-1.5 MG/5ML syrup Take 5 mLs by mouth at bedtime as needed for cough. Patient not taking: Reported on 02/20/2016 07/14/15   Colon Branch, MD  losartan (COZAAR) 25 MG tablet Take 1 tablet (25 mg total) by mouth daily. Patient not taking: Reported on 02/20/2016 07/29/15   Skeet Latch, MD  Multiple Vitamin (MULTIVITAMIN) tablet Take 1 tablet by mouth daily. Reported on 02/20/2016    Historical Provider, MD   No Known Allergies  Review of Systems  Constitutional: Positive for fever, chills and fatigue. Negative for appetite change.  HENT: Positive for congestion. Negative for sore throat.   Respiratory: Positive for cough. Negative for shortness of breath and wheezing.   Gastrointestinal: Positive for constipation. Negative for nausea, vomiting and diarrhea.  Musculoskeletal: Positive for myalgias.       Objective:   Physical Exam  Constitutional: She is oriented to person, place, and time. She appears well-developed and well-nourished. No distress.  HENT:  Head: Normocephalic and atraumatic.  Right Ear: Tympanic membrane normal.  Left Ear: Tympanic membrane normal.  Nose: Rhinorrhea present.  Mouth/Throat: Mucous membranes are dry. No oropharyngeal exudate or posterior oropharyngeal erythema.  Eyes: EOM are normal. Pupils are equal, round, and reactive to light.  Neck: Neck supple. No thyromegaly present.  Cardiovascular: Normal rate, regular rhythm, S1 normal, S2 normal and normal heart sounds.   No murmur heard. Pulmonary/Chest: Effort normal  and breath sounds normal. No respiratory distress.  Musculoskeletal: Normal range of motion.  Neurological: She is alert and oriented to person, place, and time.  Skin: Skin is warm and dry.  Definitely febrile  Psychiatric: She has a normal mood and affect. Her behavior is normal.  Nursing note and vitals reviewed.   BP 102/64 mmHg  Pulse 106  Temp(Src) 100.2 F (37.9 C) (Oral)  Resp 16  Ht 5\' 3"  (1.6  m)  Wt 209 lb 12.8 oz (95.165 kg)  BMI 37.17 kg/m2  SpO2 97%  LMP 02/15/2016   Results for orders placed or performed in visit on 02/20/16  POCT Influenza A/B  Result Value Ref Range   Influenza A, POC Negative Negative   Influenza B, POC Negative Negative      Assessment & Plan:   1. Fever, unspecified   2. Myalgia and myositis   Suspect viral URI - flu-like. Keep antipyretics on board scheduled and push fluids to avoid dehydration. Hold spironolactone x 2d and lasix x 4d. RTC if sxs cont.  Orders Placed This Encounter  Procedures  . POCT Influenza A/B    Meds ordered this encounter  Medications  . DISCONTD: carvedilol (COREG) 25 MG tablet    Sig: Take 25 mg by mouth 2 (two) times daily with a meal.  . HYDROcodone-homatropine (HYCODAN) 5-1.5 MG/5ML syrup    Sig: Take 5 mLs by mouth every 4 (four) hours as needed for cough.    Dispense:  180 mL    Refill:  0  . oseltamivir (TAMIFLU) 75 MG capsule    Sig: Take 1 capsule (75 mg total) by mouth 2 (two) times daily.    Dispense:  10 capsule    Refill:  0  . DISCONTD: acetaminophen (TYLENOL) tablet 975 mg    Sig:   . DISCONTD: ondansetron (ZOFRAN ODT) 4 MG disintegrating tablet    Sig: Take 1 tablet (4 mg total) by mouth every 8 (eight) hours as needed for nausea or vomiting.    Dispense:  15 tablet    Refill:  0  . acetaminophen (TYLENOL) tablet 1,000 mg    Sig:     I personally performed the services described in this documentation, which was scribed in my presence. The recorded information has been reviewed and considered, and addended by me as needed.   Delman Cheadle, M.D.  [facility] [address] [place], [place] [address] [phone] phone [phone] fax  02/29/2016 10:12 PM

## 2016-02-20 NOTE — Patient Instructions (Addendum)
IF you received an x-ray today, you will receive an invoice from [facility]. Please contact [facility] at [phone] with questions or concerns regarding your invoice.   IF you received labwork today, you will receive an invoice from [employer]. Please contact [facility] at [phone] with questions or concerns regarding your invoice.   Our billing staff will not be able to assist you with questions regarding bills from these companies.  You will be contacted with the lab results as soon as they are available. The fastest way to get your results is to activate your My Chart account. Instructions are located on the last page of this paperwork. If you have not heard from [place] regarding the results in 2 weeks, please contact this office.     Take 1 g of tylenol every 8 hours.  Keep up with tons of fluid.  Hold the spironolactone for 2 days. No lasix for 4 days.  Dehydration, Adult Dehydration is a condition in which you do not have enough fluid or water in your body. It happens when you take in less fluid than you lose. Vital organs such as the kidneys, brain, and heart cannot function without a proper amount of fluids. Any loss of fluids from the body can cause dehydration.  Dehydration can range from mild to severe. This condition should be treated right away to help prevent it from becoming severe. CAUSES  This condition may be caused by:  Vomiting.  Diarrhea.  Excessive sweating, such as when exercising in hot or humid weather.  Not drinking enough fluid during strenuous exercise or during an illness.  Excessive urine output.  Fever.  Certain medicines. RISK FACTORS This condition is more likely to develop in:  People who are taking certain medicines that cause the body to lose excess fluid (diuretics).   People who have a chronic illness, such as diabetes, that may increase urination.  Older adults.   People who live  at high altitudes.   People who participate in endurance sports.  SYMPTOMS  Mild Dehydration  Thirst.  Dry lips.  Slightly dry mouth.  Dry, warm skin. Moderate Dehydration  Very dry mouth.   Muscle cramps.   Dark urine and decreased urine production.   Decreased tear production.   Headache.   Light-headedness, especially when you stand up from a sitting position.  Severe Dehydration  Changes in skin.   Cold and clammy skin.   Skin does not spring back quickly when lightly pinched and released.   Changes in body fluids.   Extreme thirst.   No tears.   Not able to sweat when body temperature is high, such as in hot weather.   Minimal urine production.   Changes in vital signs.   Rapid, weak pulse (more than 100 beats per minute when you are sitting still).   Rapid breathing.   Low blood pressure.   Other changes.   Sunken eyes.   Cold hands and feet.   Confusion.  Lethargy and difficulty being awakened.  Fainting (syncope).   Short-term weight loss.   Unconsciousness. DIAGNOSIS  This condition may be diagnosed based on your symptoms. You may also have tests to determine how severe your dehydration is. These tests may include:   Urine tests.   Blood tests.  TREATMENT  Treatment for this condition depends on the severity. Mild or moderate dehydration can often be treated at home. Treatment should be started right away. Do not wait until dehydration  becomes severe. Severe dehydration needs to be treated at the hospital. Treatment for Mild Dehydration  Drinking plenty of water to replace the fluid you have lost.   Replacing minerals in your blood (electrolytes) that you may have lost.  Treatment for Moderate Dehydration  Consuming oral rehydration solution (ORS). Treatment for Severe Dehydration  Receiving fluid through an IV tube.   Receiving electrolyte solution through a feeding tube that is passed  through your nose and into your stomach (nasogastric tube or NG tube).  Correcting any abnormalities in electrolytes. HOME CARE INSTRUCTIONS   Drink enough fluid to keep your urine clear or pale yellow.   Drink water or fluid slowly by taking small sips. You can also try sucking on ice cubes.  Have food or beverages that contain electrolytes. Examples include bananas and sports drinks.  Take over-the-counter and prescription medicines only as told by your health care provider.   Prepare ORS according to the manufacturer's instructions. Take sips of ORS every 5 minutes until your urine returns to normal.  If you have vomiting or diarrhea, continue to try to drink water, ORS, or both.   If you have diarrhea, avoid:   Beverages that contain caffeine.   Fruit juice.   Milk.   Carbonated soft drinks.  Do not take salt tablets. This can lead to the condition of having too much sodium in your body (hypernatremia).  SEEK MEDICAL CARE IF:  You cannot eat or drink without vomiting.  You have had moderate diarrhea during a period of more than 24 hours.  You have a fever. SEEK IMMEDIATE MEDICAL CARE IF:   You have extreme thirst.  You have severe diarrhea.  You have not urinated in 6-8 hours, or you have urinated only a small amount of very dark urine.  You have shriveled skin.  You are dizzy, confused, or both.   This information is not intended to replace advice given to you by your health care provider. Make sure you discuss any questions you have with your health care provider.   Document Released: 09/05/2005 Document Revised: 05/27/2015 Document Reviewed: 01/21/2015 Elsevier Interactive Patient Education [employer].

## 2016-02-23 ENCOUNTER — Encounter: Payer: Self-pay | Admitting: Family Medicine

## 2016-02-23 ENCOUNTER — Ambulatory Visit (INDEPENDENT_AMBULATORY_CARE_PROVIDER_SITE_OTHER): Payer: BLUE CROSS/BLUE SHIELD | Admitting: Family Medicine

## 2016-02-23 VITALS — BP 108/70 | HR 66 | Temp 98.4°F | Ht 63.0 in | Wt 209.6 lb

## 2016-02-23 DIAGNOSIS — N390 Urinary tract infection, site not specified: Secondary | ICD-10-CM

## 2016-02-23 DIAGNOSIS — R300 Dysuria: Secondary | ICD-10-CM | POA: Diagnosis not present

## 2016-02-23 LAB — POC URINALSYSI DIPSTICK (AUTOMATED)
BILIRUBIN UA: NEGATIVE
Blood, UA: NEGATIVE
GLUCOSE UA: NEGATIVE
KETONES UA: NEGATIVE
NITRITE UA: POSITIVE
Protein, UA: NEGATIVE
Spec Grav, UA: 1.02
Urobilinogen, UA: 2
pH, UA: 6

## 2016-02-23 MED ORDER — CIPROFLOXACIN HCL 500 MG PO TABS: 500.0000 mg | ORAL_TABLET | Freq: Two times a day (BID) | ORAL | Status: DC

## 2016-02-23 MED ORDER — LIDOCAINE HCL 1 % IJ SOLN
1.0000 g | Freq: Once | INTRAMUSCULAR | Status: AC
  Administered 2016-02-23: 1 g via INTRAMUSCULAR

## 2016-02-23 MED ORDER — PHENAZOPYRIDINE HCL 200 MG PO TABS: 200.0000 mg | ORAL_TABLET | Freq: Three times a day (TID) | ORAL | Status: DC | PRN

## 2016-02-23 NOTE — Patient Instructions (Signed)

## 2016-02-23 NOTE — Addendum Note (Signed)
Addended by: Dorrene German on: 02/23/2016 03:14 PM   Modules accepted: Orders

## 2016-02-23 NOTE — Progress Notes (Signed)
Patient ID: Danielle Burns, female    DOB: 22-Jan-1971  Age: 45 y.o. MRN: [id number]    Subjective:  Subjective HPI Samyra Eskola presents for dysuria and frequency. Since sat--- pt has had chills, fever, urinary frequency.   Dysuria just started.    Review of Systems  Constitutional: Negative for diaphoresis, appetite change, fatigue and unexpected weight change.  Eyes: Negative for pain, redness and visual disturbance.  Respiratory: Negative for cough, chest tightness, shortness of breath and wheezing.   Cardiovascular: Negative for chest pain, palpitations and leg swelling.  Endocrine: Negative for cold intolerance, heat intolerance, polydipsia, polyphagia and polyuria.  Genitourinary: Positive for dysuria, urgency and frequency. Negative for difficulty urinating.  Neurological: Negative for dizziness, light-headedness, numbness and headaches.    History Past Medical History  Diagnosis Date  . Gestational diabetes   . Palpitations   . Peripartum cardiomyopathy   . Recurrent UTI   . Chronic systolic heart failure ([place]) 07/29/2015    She has past surgical history that includes Tubal ligation.   Her family history includes Diabetes in her mother; Hypertension in her mother; Parkinsonism in her father. There is no history of Cancer.She reports that she has never smoked. She has never used smokeless tobacco. She reports that she does not drink alcohol. Her drug history is not on file.  Current Outpatient Prescriptions on File Prior to Visit  Medication Sig Dispense Refill  . furosemide (LASIX) 20 MG tablet Take 1 tablet (20 mg total) by mouth daily. 30 tablet 11  . HYDROcodone-homatropine (HYCODAN) 5-1.5 MG/5ML syrup Take 5 mLs by mouth every 4 (four) hours as needed for cough. 180 mL 0  . metoprolol succinate (TOPROL-XL) 25 MG 24 hr tablet Take 1 tablet (25 mg total) by mouth daily. 30 tablet 11  . Multiple Vitamin (MULTIVITAMIN) tablet Take 1 tablet by mouth daily. Reported on 02/20/2016      . oseltamivir (TAMIFLU) 75 MG capsule Take 1 capsule (75 mg total) by mouth 2 (two) times daily. 10 capsule 0  . spironolactone (ALDACTONE) 25 MG tablet Take 0.5 tablets (12.5 mg total) by mouth daily. 45 tablet 3   No current facility-administered medications on file prior to visit.     Objective:  Objective Physical Exam  Psychiatric: She has a normal mood and affect. Her behavior is normal. Judgment and thought content normal.  Nursing note and vitals reviewed.  BP 108/70 mmHg  Pulse 66  Temp(Src) 98.4 F (36.9 C) (Other (Comment))  Ht 5\' 3"  (1.6 m)  Wt 209 lb 9.6 oz (95.074 kg)  BMI 37.14 kg/m2  SpO2 98%  LMP 02/15/2016 Wt Readings from Last 3 Encounters:  02/23/16 209 lb 9.6 oz (95.074 kg)  02/20/16 209 lb 12.8 oz (95.165 kg)  12/22/15 214 lb 6.4 oz (97.251 kg)     Lab Results  Component Value Date   WBC 11.1* 06/04/2015   HGB 9.0* 06/04/2015   HCT 28.4* 06/04/2015   PLT 374.0 06/04/2015   GLUCOSE 132* 09/03/2015   CHOL 172 06/04/2015   TRIG 96.0 06/04/2015   HDL 40.50 06/04/2015   LDLCALC 112* 06/04/2015   ALT 12 06/04/2015   AST 17 06/04/2015   NA 138 09/03/2015   K 4.2 09/03/2015   CL 105 09/03/2015   CREATININE 0.75 09/03/2015   BUN 8 09/03/2015   CO2 23 09/03/2015   TSH 0.61 06/04/2015   HGBA1C 5.7 01/07/2009    Dg Chest 2 View  07/14/2015  CLINICAL DATA:  Cough, congestion x4 weeks  EXAM: CHEST  2 VIEW COMPARISON:  06/04/2015 FINDINGS: Lungs are clear.  No pleural effusion or pneumothorax. The heart is top-normal in size. Visualized osseous structures are within normal limits. IMPRESSION: No evidence of acute cardiopulmonary disease. Electronically Signed   By: Julian Hy M.D.   On: 07/14/2015 12:34       Color, UA  Yellow Yellow Yellow     Clarity, UA  Turbid Cloudy Cloudy     Glucose, UA  Neg Neg Neg NEG    Bilirubin, UA  Neg Neg Neg     Ketones, UA  Neg Neg Neg     Spec Grav, UA  1.020 1.020 1.010     Blood, UA  Neg Small Mod      pH, UA  6.0 6.0 6.0     Protein, UA  Neg Neg Trace     Urobilinogen, UA  2.0 2.0 0.2 0.2    Nitrite, UA  Positive Neg Pos     Leukocytes, UA Negative  large (3+) (A) small (1+) (A) small (1+)R TRACE (A)R   Resulting Agency    [facility]      Specimen Collected: 02/23/16 2:43 PM         Assessment & Plan:  Plan I have discontinued Ms. Thumm's carvedilol and ondansetron. I am also having her maintain her multivitamin, metoprolol succinate, furosemide, spironolactone, HYDROcodone-homatropine, oseltamivir, and losartan.  Meds ordered this encounter  Medications  . losartan (COZAAR) 25 MG tablet    Sig: Take 1 tablet by mouth daily.    Refill:  9    Problem List Items Addressed This Visit    None    Visit Diagnoses    Dysuria    -  Primary    Relevant Orders    POCT Urinalysis Dipstick (Automated) (Completed)    Urine Culture       Follow-up: No Follow-up on file.  Ann Held, DO

## 2016-02-23 NOTE — Progress Notes (Signed)
Pre visit review using our clinic review tool, if applicable. No additional management support is needed unless otherwise documented below in the visit note. 

## 2016-02-25 LAB — URINE CULTURE

## 2016-08-05 ENCOUNTER — Other Ambulatory Visit: Payer: Self-pay | Admitting: Cardiovascular Disease

## 2016-08-05 NOTE — Telephone Encounter (Signed)
Please review for refill. Thanks!  

## 2016-08-05 NOTE — Telephone Encounter (Signed)
Rx request sent to pharmacy.  

## 2016-08-17 NOTE — Telephone Encounter (Signed)
Please review for refill. Thanks!  

## 2016-09-07 ENCOUNTER — Telehealth: Payer: Self-pay

## 2016-09-07 NOTE — Telephone Encounter (Signed)
PATIENT STATES SHE HAD IMMUNIZATIONS AND A HEPATITIS A SHOT LAST December. SHE IS GOING TO [place] ON Saturday AND SHE WANTS TO KNOW IF SHE COMPLETED THE SERIES OR IF SHE NEEDS TO COME BACK TO HAVE IT DONE AGAIN? BEST PHONE [phone] (CELL)  [place]

## 2016-09-08 ENCOUNTER — Other Ambulatory Visit: Payer: Self-pay | Admitting: Cardiovascular Disease

## 2016-09-08 NOTE — Telephone Encounter (Signed)
Pt seen 02/2016 by dr Brigitte Pulse, just needs 2nd hep a today or tomorrow, ok to give fast track?

## 2016-09-08 NOTE — Telephone Encounter (Signed)
Please review for refill.  

## 2016-09-09 NOTE — Telephone Encounter (Signed)
Ok to come in for immunization. Please advise she is likely not to have immunity as the series should be completed within 6 months according to [facility]. However, I recommend that she obtain vaccine.  Carroll Sage. Kenton Kingfisher, MSN, FNP-C [facility]

## 2016-09-13 NOTE — Telephone Encounter (Signed)
lmtcb for appt for vaccine

## 2017-02-04 ENCOUNTER — Other Ambulatory Visit: Payer: Self-pay | Admitting: Cardiovascular Disease

## 2017-02-06 NOTE — Telephone Encounter (Signed)
Rx(s) sent to pharmacy electronically.  

## 2017-02-06 NOTE — Telephone Encounter (Signed)
Refill Request.  

## 2017-05-04 ENCOUNTER — Ambulatory Visit (INDEPENDENT_AMBULATORY_CARE_PROVIDER_SITE_OTHER): Payer: 59 | Admitting: Cardiovascular Disease

## 2017-05-04 ENCOUNTER — Encounter: Payer: Self-pay | Admitting: Cardiovascular Disease

## 2017-05-04 VITALS — BP 104/60 | HR 100 | Ht 63.0 in | Wt 215.0 lb

## 2017-05-04 DIAGNOSIS — R002 Palpitations: Secondary | ICD-10-CM | POA: Diagnosis not present

## 2017-05-04 DIAGNOSIS — R0602 Shortness of breath: Secondary | ICD-10-CM

## 2017-05-04 DIAGNOSIS — I5042 Chronic combined systolic (congestive) and diastolic (congestive) heart failure: Secondary | ICD-10-CM | POA: Diagnosis not present

## 2017-05-04 DIAGNOSIS — Z5181 Encounter for therapeutic drug level monitoring: Secondary | ICD-10-CM | POA: Diagnosis not present

## 2017-05-04 NOTE — Progress Notes (Signed)
===   Cardiology Office Note   Date:  05/04/2017   ID:  Danielle Burns, DOB [birth year], MRN [id number]  PCP:  Carollee Herter, Alferd Apa, DO  Cardiologist:   Skeet Latch, MD   Chief Complaint  Patient presents with  . Follow-up    pt denied chest pain, pt c/o SOB and little swelling in feet    Patient ID: Danielle Burns is a 46 y.o. female with peripartum cardiomyopathy, chronic systolic and diastolic heart failure, and palpitations who presents for follow up on chronic systolic heart failure.  Danielle Burns has a history of peripartum cardiomyopathy that was diagnosed in 2012. At the time her ejection fraction was reportedly in the 30s. She subsequently recovered normal systolic function was dismissed from Dr. Tanna Furry clinic. However she was again seen in clinic 04/2015 due to weight gain and edema. She was referred for an echo 05/05/15 that showed that her LVEF is again reduced to 40-45%.  She was started on metoprolol and lisinopril.  She developed a cough so lisinopril was switched to losartan. Spironolactone was added to her regimen. Her symptoms improved and her BNP was 15.   Since her last appointment Danielle Burns has been doing well. She continues to have some shortness of breath with significant exertion but denies chest pain. She has not noted any orthopnea but does get swelling in her feet at the end of work. She works as a Marine scientist at [employer].  Compression stockings are helpful and her edema improves with elevation. She checks her weight regularly and it fluctuates.  She denies any lightheadedness or dizziness. She has some episodes of heart racing that last for a few minutes at a time there is no associated lightheadedness, dizziness, or chest pain.   Past Medical History:  Diagnosis Date  . Chronic systolic heart failure ([place]) 07/29/2015  . Gestational diabetes   . Palpitations   . Peripartum cardiomyopathy   . Recurrent UTI     Past Surgical History:  Procedure Laterality Date    . TUBAL LIGATION       Current Outpatient Prescriptions  Medication Sig Dispense Refill  . furosemide (LASIX) 20 MG tablet Take 1 tablet (20 mg total) by mouth daily. PLEASE CONTACT OFFICE FOR ADDITIONAL REFILLS FINAL WARNING 7 tablet 0  . losartan (COZAAR) 25 MG tablet Take 1 tablet (25 mg total) by mouth daily. PLEASE CONTACT OFFICE FOR ADDITIONAL REFILLS FINAL WARNING 7 tablet 0  . metoprolol succinate (TOPROL-XL) 25 MG 24 hr tablet Take 1 tablet (25 mg total) by mouth daily. PLEASE CONTACT OFFICE FOR ADDITIONAL REFILLS FINAL WARNING 7 tablet 0  . Multiple Vitamin (MULTIVITAMIN) tablet Take 1 tablet by mouth daily. Reported on 02/20/2016    . spironolactone (ALDACTONE) 25 MG tablet Take 0.5 tablets (12.5 mg total) by mouth daily. PLEASE CONTACT OFFICE FOR ADDITIONAL REFILLS FINAL WARNING 5 tablet 0   No current facility-administered medications for this visit.     Allergies:   Patient has no known allergies.    Social History:  The patient  reports that she has never smoked. She has never used smokeless tobacco. She reports that she does not drink alcohol.   Family History:  The patient's family history includes Diabetes in her mother; Hypertension in her mother; Parkinsonism in her father.    ROS:  Please see the history of present illness.   Otherwise, review of systems are positive for burning in hands.   All other systems are reviewed and negative.  PHYSICAL EXAM: VS:  BP 104/60   Pulse 100   Ht 5\' 3"  (1.6 m)   Wt 97.5 kg (215 lb)   BMI 38.09 kg/m  , BMI Body mass index is 38.09 kg/m. GENERAL:  Well appearing HEENT: Pupils equal round and reactive, fundi not visualized, oral mucosa unremarkable NECK:  No jugular venous distention, waveform within normal limits, carotid upstroke brisk and symmetric, no bruits LUNGS:  Clear to auscultation bilaterally HEART:  RRR.  PMI not displaced or sustained,S1 and S2 within normal limits, no S3, no S4, no clicks, no rubs, no  murmurs ABD:  Flat, positive bowel sounds normal in frequency in pitch, no bruits, no rebound, no guarding, no midline pulsatile mass, no hepatomegaly, no splenomegaly EXT:  2 plus pulses throughout, trace edema, no cyanosis no clubbing SKIN:  No rashes no nodules NEURO:  Cranial nerves II through XII grossly intact, motor grossly intact throughout PSYCH:  Cognitively intact, oriented to person place and time   EKG:  EKG is ordered today. 05/04/17: Sinus rhythm. Rate 100 bpm.  TTE 05/2011: EF 55-60%.    TTE 05/05/15: Study Conclusions  - Left ventricle: The cavity size was normal. Wall thickness was normal. Systolic function was mildly reduced. The estimated ejection fraction was in the range of 45% to 50%. Diffuse hypokinesis. Left ventricular diastolic function parameters were normal. - Mitral valve: There was mild regurgitation. - Pericardium, extracardiac: A trivial pericardial effusion was identified.  Treadmill stress test 05/07/15:  Negative adequate ETT.  Recent Labs: No results found for requested labs within last 8760 hours.    Lipid Panel    Component Value Date/Time   CHOL 172 06/04/2015 1143   TRIG 96.0 06/04/2015 1143   HDL 40.50 06/04/2015 1143   CHOLHDL 4 06/04/2015 1143   VLDL 19.2 06/04/2015 1143   LDLCALC 112 (H) 06/04/2015 1143      Wt Readings from Last 3 Encounters:  05/04/17 97.5 kg (215 lb)  02/23/16 95.1 kg (209 lb 9.6 oz)  02/20/16 95.2 kg (209 lb 12.8 oz)     Other studies Reviewed: Additional studies/ records that were reviewed today include: n/a. Review of the above records demonstrates:  Please see elsewhere in the note.     ASSESSMENT AND PLAN:  # Chronic systolic heart failure:  Danielle Burns is euvolemic.  LVEF 45-50%.  Continue losartan, metoprolol, lasix, and spironolactone.  Check BMP for drug monitoring.   # Palpitations: Stable.  Continue metoprolol.   Current medicines are reviewed at length with the patient  today.  The patient does not have concerns regarding medicines.  The following changes have been made:    Labs/ tests ordered today include:  BMP    Orders Placed This Encounter  Procedures  . Basic metabolic panel     Disposition:   FU with Dr. Jonelle Sidle C. Oval Linsey in 1 year.   Signed, Skeet Latch, MD  05/04/2017 5:13 PM    [facility]

## 2017-05-04 NOTE — Patient Instructions (Signed)
Medication Instructions:  Your physician recommends that you continue on your current medications as directed. Please refer to the Current Medication list given to you today.  Labwork: BMET TODAY   Testing/Procedures: NONE  Follow-Up: Your physician wants you to follow-up in: 1 YEAR OV  You will receive a reminder letter in the mail two months in advance. If you don't receive a letter, please call our office to schedule the follow-up appointment.   If you need a refill on your cardiac medications before your next appointment, please call your pharmacy.  

## 2017-05-05 LAB — BASIC METABOLIC PANEL
BUN / CREAT RATIO: 14 (ref 9–23)
BUN: 12 mg/dL (ref 6–24)
CALCIUM: 9.1 mg/dL (ref 8.7–10.2)
CO2: 22 mmol/L (ref 20–29)
CREATININE: 0.88 mg/dL (ref 0.57–1.00)
Chloride: 105 mmol/L (ref 96–106)
GFR calc Af Amer: 91 mL/min/{1.73_m2} (ref >59)
GFR calc non Af Amer: 79 mL/min/{1.73_m2} (ref >59)
GLUCOSE: 163 mg/dL — AB (ref 65–99)
POTASSIUM: 4.4 mmol/L (ref 3.5–5.2)
SODIUM: 140 mmol/L (ref 134–144)

## 2017-05-19 ENCOUNTER — Telehealth: Payer: Self-pay | Admitting: Cardiovascular Disease

## 2017-05-19 NOTE — Telephone Encounter (Signed)
Spoke to patient. BMP  Result given . Verbalized understanding  

## 2017-05-19 NOTE — Telephone Encounter (Signed)
New Message ° °Pt calling to get test results °

## 2017-12-29 ENCOUNTER — Telehealth: Payer: Self-pay | Admitting: Cardiovascular Disease

## 2017-12-29 NOTE — Telephone Encounter (Signed)
Attempt to return call, line busy.   Attempt to call back x 2-no answer and unable to leave VM

## 2017-12-29 NOTE — Telephone Encounter (Signed)
New Message    Pt c/o swelling: STAT is pt has developed SOB within 24 hours  1) How much weight have you gained and in what time span? 1-2 pounds   2) If swelling, where is the swelling located? On and off for the last 2 weeks  , in left leg   3) Are you currently taking a fluid pill? No - they were discontinued  4) Are you currently SOB?  Yes   5) Do you have a log of your daily weights (if so, list)? No   6) Have you gained 3 pounds in a day or 5 pounds in a week? no  7) Have you traveled recently? no

## 2018-01-02 NOTE — Telephone Encounter (Signed)
Unable to reach pt or leave a message  

## 2018-01-04 NOTE — Telephone Encounter (Signed)
Unable to reach pt or leave a message  

## 2018-01-25 ENCOUNTER — Ambulatory Visit: Payer: 59 | Admitting: Cardiovascular Disease

## 2018-03-13 ENCOUNTER — Other Ambulatory Visit: Payer: Self-pay

## 2018-03-13 ENCOUNTER — Encounter (HOSPITAL_COMMUNITY): Payer: Self-pay

## 2018-03-13 ENCOUNTER — Emergency Department (HOSPITAL_COMMUNITY)
Admission: EM | Admit: 2018-03-13 | Discharge: 2018-03-14 | Disposition: A | Discharge status: Home / Self Care | Payer: 59 | Attending: Emergency Medicine | Admitting: Emergency Medicine

## 2018-03-13 ENCOUNTER — Emergency Department (HOSPITAL_COMMUNITY): Payer: 59

## 2018-03-13 DIAGNOSIS — I5022 Chronic systolic (congestive) heart failure: Secondary | ICD-10-CM | POA: Insufficient documentation

## 2018-03-13 DIAGNOSIS — D649 Anemia, unspecified: Secondary | ICD-10-CM | POA: Insufficient documentation

## 2018-03-13 DIAGNOSIS — R079 Chest pain, unspecified: Secondary | ICD-10-CM

## 2018-03-13 DIAGNOSIS — R0789 Other chest pain: Secondary | ICD-10-CM | POA: Diagnosis not present

## 2018-03-13 LAB — CBC
HEMATOCRIT: 28.8 % — AB (ref 36.0–46.0)
HEMOGLOBIN: 8.6 g/dL — AB (ref 12.0–15.0)
MCH: 19.9 pg — ABNORMAL LOW (ref 26.0–34.0)
MCHC: 29.9 g/dL — ABNORMAL LOW (ref 30.0–36.0)
MCV: 66.5 fL — ABNORMAL LOW (ref 78.0–100.0)
Platelets: 380 10*3/uL (ref 150–400)
RBC: 4.33 MIL/uL (ref 3.87–5.11)
RDW: 18.6 % — AB (ref 11.5–15.5)
WBC: 10.1 10*3/uL (ref 4.0–10.5)

## 2018-03-13 LAB — I-STAT BETA HCG BLOOD, ED (MC, WL, AP ONLY): I-stat hCG, quantitative: <5 m[IU]/mL (ref <5)

## 2018-03-13 LAB — BASIC METABOLIC PANEL
ANION GAP: 9 (ref 5–15)
BUN: 6 mg/dL (ref 6–20)
CO2: 21 mmol/L — ABNORMAL LOW (ref 22–32)
Calcium: 8.8 mg/dL — ABNORMAL LOW (ref 8.9–10.3)
Chloride: 109 mmol/L (ref 98–111)
Creatinine, Ser: 0.81 mg/dL (ref 0.44–1.00)
GFR calc Af Amer: >60 mL/min (ref >60)
GFR calc non Af Amer: >60 mL/min (ref >60)
Glucose, Bld: 89 mg/dL (ref 70–99)
POTASSIUM: 3.8 mmol/L (ref 3.5–5.1)
SODIUM: 139 mmol/L (ref 135–145)

## 2018-03-13 LAB — I-STAT TROPONIN, ED: Troponin i, poc: 0 ng/mL (ref 0.00–0.08)

## 2018-03-13 NOTE — ED Provider Notes (Signed)
[place] EMERGENCY DEPARTMENT Provider Note   CSN: [id number] Arrival date & time: 03/13/18  2101     History   Chief Complaint Chief Complaint  Patient presents with  . Chest Pain    HPI Danielle Burns is a 47 y.o. female.  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 She denies SOB. She did have nausea with vomiting (dry heaves). The tightness was constant, improving over time and is resolved now. No modifying factors. No recent illness, fever or cough.    The history is provided by the patient. No language interpreter was used.  Chest Pain   Pertinent negatives include no abdominal pain, no fever, no nausea and no shortness of breath.    Past Medical History:  Diagnosis Date  . Chronic systolic heart failure ([place]) 07/29/2015  . Gestational diabetes   . Palpitations   . Peripartum cardiomyopathy   . Recurrent UTI     Patient Active Problem List   Diagnosis Date Noted  . Chronic systolic heart failure ([place]) 07/29/2015  . Fever of unknown origin 06/04/2015  . Pharyngitis 07/31/2014  . Viral syndrome 07/31/2014  . UTI (urinary tract infection) 07/31/2014  . Otitis media 07/04/2012  . Preventative health care 12/14/2010  . Allergic rhinitis 12/14/2010  . RESTLESS LEG SYNDROME 10/18/2010  . KNEE PAIN, LEFT 10/18/2010  . 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  . PALPITATIONS 01/02/2009  . DIABETES MELLITUS, GESTATIONAL, HX OF 12/26/2007    Past Surgical History:  Procedure Laterality Date  . TUBAL LIGATION       OB History   None      Home Medications    Prior to Admission medications   Medication Sig Start Date End Date Taking? Authorizing Provider  cetirizine (ZYRTEC) 10 MG tablet Take 10 mg by mouth daily as needed for allergies.   Yes [provider]  Multiple Vitamin  (MULTIVITAMIN) tablet Take 1 tablet by mouth 2 (two) times a week. Reported on 02/20/2016   Yes [provider]  vitamin C (ASCORBIC ACID) 500 MG tablet Take 500 mg by mouth daily as needed (when feeling sick).   Yes [provider]  furosemide (LASIX) 20 MG tablet Take 1 tablet (20 mg total) by mouth daily. PLEASE CONTACT OFFICE FOR ADDITIONAL REFILLS FINAL WARNING Patient not taking: Reported on 03/13/2018 02/06/17   Skeet Latch, MD  losartan (COZAAR) 25 MG tablet Take 1 tablet (25 mg total) by mouth daily. PLEASE CONTACT OFFICE FOR ADDITIONAL REFILLS FINAL WARNING Patient not taking: Reported on 03/13/2018 02/06/17   Skeet Latch, MD  metoprolol succinate (TOPROL-XL) 25 MG 24 hr tablet Take 1 tablet (25 mg total) by mouth daily. PLEASE CONTACT OFFICE FOR ADDITIONAL REFILLS FINAL WARNING Patient not taking: Reported on 03/13/2018 02/06/17   Skeet Latch, MD  spironolactone (ALDACTONE) 25 MG tablet Take 0.5 tablets (12.5 mg total) by mouth daily. PLEASE CONTACT OFFICE FOR ADDITIONAL REFILLS FINAL WARNING Patient not taking: Reported on 03/13/2018 02/06/17   Skeet Latch, MD    Family History Family History  Problem Relation Age of Onset  . Diabetes Mother        type II  . Hypertension Mother   . Parkinsonism Father   . Cancer Neg Hx        negative for breast and colon    Social History Social History   Tobacco Use  .  Smoking status: Never Smoker  . Smokeless tobacco: Never Used  Substance Use Topics  . Alcohol use: No  . Drug use: Not on file     Allergies   Patient has no known allergies.   Review of Systems Review of Systems  Constitutional: Negative for chills and fever.  Respiratory: Positive for chest tightness. Negative for shortness of breath.   Cardiovascular: Positive for chest pain.  Gastrointestinal: Negative.  Negative for abdominal pain and nausea.  Musculoskeletal: Negative.  Negative for myalgias.  Skin: Negative.     Neurological: Negative.      Physical Exam Updated Vital Signs BP 135/77   Pulse 73   Temp 97.8 F (36.6 C) (Oral)   Resp 20   LMP 02/27/2018   SpO2 98%   Physical Exam  Constitutional: She is oriented to person, place, and time. She appears well-developed and well-nourished.  HENT:  Head: Normocephalic.  Neck: Normal range of motion. Neck supple.  Cardiovascular: Normal rate and regular rhythm.  No murmur heard. Pulmonary/Chest: Effort normal and breath sounds normal. She has no wheezes. She has no rhonchi. She has no rales. She exhibits no tenderness.  Abdominal: Soft. Bowel sounds are normal. There is no tenderness. There is no rebound and no guarding.  Musculoskeletal: Normal range of motion. She exhibits no edema or tenderness.  Neurological: She is alert and oriented to person, place, and time.  Skin: Skin is warm and dry. No rash noted.  Psychiatric: She has a normal mood and affect.     ED Treatments / Results  Labs (all labs ordered are listed, but only abnormal results are displayed) Labs Reviewed  BASIC METABOLIC PANEL - Abnormal; Notable for the following components:      Result Value   CO2 21 (*)    Calcium 8.8 (*)    All other components within normal limits  CBC - Abnormal; Notable for the following components:   Hemoglobin 8.6 (*)    HCT 28.8 (*)    MCV 66.5 (*)    MCH 19.9 (*)    MCHC 29.9 (*)    RDW 18.6 (*)    All other components within normal limits  TROPONIN I  I-STAT TROPONIN, ED  I-STAT BETA HCG BLOOD, ED ([facility], [facility], [facility] ONLY)   Results for orders placed or performed during the hospital encounter of 67/59/16  Basic metabolic panel  Result Value Ref Range   Sodium 139 135 - 145 mmol/L   Potassium 3.8 3.5 - 5.1 mmol/L   Chloride 109 98 - 111 mmol/L   CO2 21 (L) 22 - 32 mmol/L   Glucose, Bld 89 70 - 99 mg/dL   BUN 6 6 - 20 mg/dL   Creatinine, Ser 0.81 0.44 - 1.00 mg/dL   Calcium 8.8 (L) 8.9 - 10.3 mg/dL   GFR calc non Af Amer >60 >60  mL/min   GFR calc Af Amer >60 >60 mL/min   Anion gap 9 5 - 15  CBC  Result Value Ref Range   WBC 10.1 4.0 - 10.5 K/uL   RBC 4.33 3.87 - 5.11 MIL/uL   Hemoglobin 8.6 (L) 12.0 - 15.0 g/dL   HCT 28.8 (L) 36.0 - 46.0 %   MCV 66.5 (L) 78.0 - 100.0 fL   MCH 19.9 (L) 26.0 - 34.0 pg   MCHC 29.9 (L) 30.0 - 36.0 g/dL   RDW 18.6 (H) 11.5 - 15.5 %   Platelets 380 150 - 400 K/uL  I-stat troponin, ED  Result Value Ref Range   Troponin i, poc 0.00 0.00 - 0.08 ng/mL   Comment 3          I-Stat beta hCG blood, ED  Result Value Ref Range   I-stat hCG, quantitative <5.0 <5 mIU/mL   Comment 3            EKG EKG Interpretation  Date/Time:  Tuesday March 13 2018 21:13:45 EDT Ventricular Rate:  73 PR Interval:  138 QRS Duration: 78 QT Interval:  380 QTC Calculation: 418 R Axis:   80 Text Interpretation:  Normal sinus rhythm Normal ECG compared with 11/08 - no significant change Confirmed by Aletta Edouard [phone]) on 03/13/2018 10:10:16 PM   Radiology Dg Chest 2 View  Result Date: 03/13/2018 CLINICAL DATA:  Central chest pain starting this afternoon. EXAM: CHEST - 2 VIEW COMPARISON:  None. FINDINGS: The heart size and mediastinal contours are within normal limits. Both lungs are clear. The visualized skeletal structures are unremarkable. IMPRESSION: No active cardiopulmonary disease. Electronically Signed   By: Ashley Royalty M.D.   On: 03/13/2018 22:30    Procedures Procedures (including critical care time)  Medications Ordered in ED Medications - No data to display   Initial Impression / Assessment and Plan / ED Course  I have reviewed the triage vital signs and the nursing notes.  Pertinent labs & imaging results that were available during my care of the patient were reviewed by me and considered in my medical decision making (see chart for details).     Patient here for evaluation of chest tightness and nausea. No SOB, cough. No fever.   Labs, CXR, EKG all reassuring. Hgb 8.6 with  history of anemia. No tachycardia, lightheadedness, fatigue. Doubt symptomatic from this. Chest symptoms atypical for ACS. Delta troponin was obtained and is negative as well.   She is symptom free currently. She is felt stable for discharge home. She is encouraged to follow up with PCP for recheck. Return precautions discussed.   Final Clinical Impressions(s) / ED Diagnoses   Final diagnoses:  None   1. Nonspecific chest pain 2. Anemia  ED Discharge Orders    None       Charlann Lange, Hershal Coria 03/16/18 2210    Hayden Rasmussen, MD 03/17/18 [phone]

## 2018-03-13 NOTE — ED Triage Notes (Signed)
Pt reports that central CP began this afternoon with nausea, denies SOB. Hx of CHF

## 2018-03-14 ENCOUNTER — Telehealth: Payer: Self-pay

## 2018-03-14 LAB — TROPONIN I

## 2018-03-14 NOTE — Discharge Instructions (Signed)
Your labs, EKG and CXR are all normal. You can be discharged home with recommendation to follow up with your primary care provider and to keep you already scheduled appointment with your cardiologist for recheck. Return to the emergency department with any new or concerning symptoms.

## 2018-03-14 NOTE — Telephone Encounter (Signed)
ED follow up scheduled with Dr. Carollee Herter.

## 2018-03-16 ENCOUNTER — Ambulatory Visit (INDEPENDENT_AMBULATORY_CARE_PROVIDER_SITE_OTHER): Payer: 59 | Admitting: Family Medicine

## 2018-03-16 ENCOUNTER — Encounter: Payer: Self-pay | Admitting: Family Medicine

## 2018-03-16 VITALS — BP 116/60 | HR 80 | Temp 98.8°F | Resp 16 | Ht 63.0 in | Wt 210.6 lb

## 2018-03-16 DIAGNOSIS — D649 Anemia, unspecified: Secondary | ICD-10-CM | POA: Diagnosis not present

## 2018-03-16 DIAGNOSIS — R1013 Epigastric pain: Secondary | ICD-10-CM | POA: Diagnosis not present

## 2018-03-16 LAB — CBC WITH DIFFERENTIAL/PLATELET
BASOS PCT: 0.6 % (ref 0.0–3.0)
Basophils Absolute: 0 10*3/uL (ref 0.0–0.1)
EOS PCT: 2.5 % (ref 0.0–5.0)
Eosinophils Absolute: 0.2 10*3/uL (ref 0.0–0.7)
HCT: 27.4 % — ABNORMAL LOW (ref 36.0–46.0)
Hemoglobin: 8.7 g/dL — ABNORMAL LOW (ref 12.0–15.0)
LYMPHS ABS: 2.9 10*3/uL (ref 0.7–4.0)
Lymphocytes Relative: 34.5 % (ref 12.0–46.0)
MCHC: 31.7 g/dL (ref 30.0–36.0)
MONOS PCT: 9.1 % (ref 3.0–12.0)
Monocytes Absolute: 0.8 10*3/uL (ref 0.1–1.0)
NEUTROS PCT: 53.3 % (ref 43.0–77.0)
Neutro Abs: 4.5 10*3/uL (ref 1.4–7.7)
Platelets: 315 10*3/uL (ref 150.0–400.0)
RBC: 4.25 Mil/uL (ref 3.87–5.11)
RDW: 18.9 % — AB (ref 11.5–15.5)
WBC: 8.4 10*3/uL (ref 4.0–10.5)

## 2018-03-16 LAB — IBC PANEL
Iron: 23 ug/dL — ABNORMAL LOW (ref 42–145)
Saturation Ratios: 4.1 % — ABNORMAL LOW (ref 20.0–50.0)
Transferrin: 398 mg/dL — ABNORMAL HIGH (ref 212.0–360.0)

## 2018-03-16 LAB — FERRITIN: FERRITIN: 4.5 ng/mL — AB (ref 10.0–291.0)

## 2018-03-16 LAB — H. PYLORI ANTIBODY, IGG: H PYLORI IGG: POSITIVE — AB

## 2018-03-16 MED ORDER — OMEPRAZOLE 20 MG PO CPDR: 20.0000 mg | DELAYED_RELEASE_CAPSULE | Freq: Every day | ORAL | 3 refills | Status: DC

## 2018-03-16 NOTE — Patient Instructions (Signed)
Anemia Anemia is a condition in which you do not have enough red blood cells or hemoglobin. Hemoglobin is a substance in red blood cells that carries oxygen. When you do not have enough red blood cells or hemoglobin (are anemic), your body cannot get enough oxygen and your organs may not work properly. As a result, you may feel very tired or have other problems. What are the causes? Common causes of anemia include:  Excessive bleeding. Anemia can be caused by excessive bleeding inside or outside the body, including bleeding from the intestine or from periods in women.  Poor nutrition.  Long-lasting (chronic) kidney, thyroid, and liver disease.  Bone marrow disorders.  Cancer and treatments for cancer.  HIV (human immunodeficiency virus) and AIDS (acquired immunodeficiency syndrome).  Treatments for HIV and AIDS.  Spleen problems.  Blood disorders.  Infections, medicines, and autoimmune disorders that destroy red blood cells.  What are the signs or symptoms? Symptoms of this condition include:  Minor weakness.  Dizziness.  Headache.  Feeling heartbeats that are irregular or faster than normal (palpitations).  Shortness of breath, especially with exercise.  Paleness.  Cold sensitivity.  Indigestion.  Nausea.  Difficulty sleeping.  Difficulty concentrating.  Symptoms may occur suddenly or develop slowly. If your anemia is mild, you may not have symptoms. How is this diagnosed? This condition is diagnosed based on:  Blood tests.  Your medical history.  A physical exam.  Bone marrow biopsy.  Your health care provider may also check your stool (feces) for blood and may do additional testing to look for the cause of your bleeding. You may also have other tests, including:  Imaging tests, such as a CT scan or MRI.  Endoscopy.  Colonoscopy.  How is this treated? Treatment for this condition depends on the cause. If you continue to lose a lot of blood,  you may need to be treated at a hospital. Treatment may include:  Taking supplements of iron, vitamin B12, or folic acid.  Taking a hormone medicine (erythropoietin) that can help to stimulate red blood cell growth.  Having a blood transfusion. This may be needed if you lose a lot of blood.  Making changes to your diet.  Having surgery to remove your spleen.  Follow these instructions at home:  Take over-the-counter and prescription medicines only as told by your health care provider.  Take supplements only as told by your health care provider.  Follow any diet instructions that you were given.  Keep all follow-up visits as told by your health care provider. This is important. Contact a health care provider if:  You develop new bleeding anywhere in the body. Get help right away if:  You are very weak.  You are short of breath.  You have pain in your abdomen or chest.  You are dizzy or feel faint.  You have trouble concentrating.  You have bloody or black, tarry stools.  You vomit repeatedly or you vomit up blood. Summary  Anemia is a condition in which you do not have enough red blood cells or enough of a substance in your red blood cells that carries oxygen (hemoglobin).  Symptoms may occur suddenly or develop slowly.  If your anemia is mild, you may not have symptoms.  This condition is diagnosed with blood tests as well as a medical history and physical exam. Other tests may be needed.  Treatment for this condition depends on the cause of the anemia. This information is not intended to replace advice   given to you by your health care provider. Make sure you discuss any questions you have with your health care provider. Document Released: 10/13/2004 Document Revised: 10/07/2016 Document Reviewed: 10/07/2016 Elsevier Interactive Patient Education  [employer].

## 2018-03-16 NOTE — Progress Notes (Signed)
Patient ID: Danielle Burns, female   DOB: Dec 18, 1970, 47 y.o.   MRN: [id number]    Subjective:  I acted as a Education administrator for Dr. Carollee Herter.  Guerry Bruin, [place]   Patient ID: Danielle Burns, female    DOB: [birth year], 47 y.o.   MRN: [id number]  Chief Complaint  Patient presents with  . ER follow up    HPI  Patient is in today for ER follow up chest pain.  Patient Care Team: Carollee Herter, Alferd Apa, DO as PCP - General (Family Medicine) Servando Salina, MD as Consulting Physician (Obstetrics and Gynecology) Skeet Latch, MD as Attending Physician (Cardiology)   Past Medical History:  Diagnosis Date  . Chronic systolic heart failure ([place]) 07/29/2015  . Gestational diabetes   . Palpitations   . Peripartum cardiomyopathy   . Recurrent UTI     Past Surgical History:  Procedure Laterality Date  . TUBAL LIGATION      Family History  Problem Relation Age of Onset  . Diabetes Mother        type II  . Hypertension Mother   . Parkinsonism Father   . Cancer Neg Hx        negative for breast and colon    Social History   Socioeconomic History  . Marital status: Married    Spouse name: Not on file  . Number of children: 3  . Years of education: Not on file  . Highest education level: Not on file  Occupational History  . Occupation: Programmer, multimedia: [employer]  Social Needs  . Financial resource strain: Not on file  . Food insecurity:    Worry: Not on file    Inability: Not on file  . Transportation needs:    Medical: Not on file    Non-medical: Not on file  Tobacco Use  . Smoking status: Never Smoker  . Smokeless tobacco: Never Used  Substance and Sexual Activity  . Alcohol use: No  . Drug use: Not on file  . Sexual activity: Not on file  Lifestyle  . Physical activity:    Days per week: Not on file    Minutes per session: Not on file  . Stress: Not on file  Relationships  . Social connections:    Talks on phone: Not on file    Gets together: Not on file   Attends religious service: Not on file    Active member of club or organization: Not on file    Attends meetings of clubs or organizations: Not on file    Relationship status: Not on file  . Intimate partner violence:    Fear of current or ex partner: Not on file    Emotionally abused: Not on file    Physically abused: Not on file    Forced sexual activity: Not on file  Other Topics Concern  . Not on file  Social History Narrative   Originally from [place].   Accompanied by son - Christean Leaf 3 yrs   2 sons and 2 daughters    Outpatient Medications Prior to Visit  Medication Sig Dispense Refill  . Multiple Vitamin (MULTIVITAMIN) tablet Take 1 tablet by mouth 2 (two) times a week. Reported on 02/20/2016    . vitamin C (ASCORBIC ACID) 500 MG tablet Take 500 mg by mouth daily as needed (when feeling sick).    . cetirizine (ZYRTEC) 10 MG tablet Take 10 mg by mouth daily as needed for allergies.    . furosemide (  LASIX) 20 MG tablet Take 1 tablet (20 mg total) by mouth daily. PLEASE CONTACT OFFICE FOR ADDITIONAL REFILLS FINAL WARNING (Patient not taking: Reported on 03/13/2018) 7 tablet 0  . losartan (COZAAR) 25 MG tablet Take 1 tablet (25 mg total) by mouth daily. PLEASE CONTACT OFFICE FOR ADDITIONAL REFILLS FINAL WARNING (Patient not taking: Reported on 03/13/2018) 7 tablet 0  . metoprolol succinate (TOPROL-XL) 25 MG 24 hr tablet Take 1 tablet (25 mg total) by mouth daily. PLEASE CONTACT OFFICE FOR ADDITIONAL REFILLS FINAL WARNING (Patient not taking: Reported on 03/13/2018) 7 tablet 0  . spironolactone (ALDACTONE) 25 MG tablet Take 0.5 tablets (12.5 mg total) by mouth daily. PLEASE CONTACT OFFICE FOR ADDITIONAL REFILLS FINAL WARNING (Patient not taking: Reported on 03/13/2018) 5 tablet 0   No facility-administered medications prior to visit.     No Known Allergies  Review of Systems  Constitutional: Negative for fever and malaise/fatigue.  HENT: Negative for congestion.   Eyes: Negative for  blurred vision.  Respiratory: Negative for cough and shortness of breath.   Cardiovascular: Negative for chest pain, palpitations and leg swelling.  Gastrointestinal: Negative for vomiting.  Musculoskeletal: Negative for back pain.  Skin: Negative for rash.  Neurological: Negative for loss of consciousness and headaches.       Objective:    Physical Exam  Constitutional: She is oriented to person, place, and time. She appears well-developed and well-nourished.  HENT:  Head: Normocephalic and atraumatic.  Eyes: Conjunctivae and EOM are normal.  Neck: Normal range of motion. Neck supple. No JVD present. Carotid bruit is not present. No thyromegaly present.  Cardiovascular: Normal rate, regular rhythm and normal heart sounds.  No murmur heard. Pulmonary/Chest: Effort normal and breath sounds normal. No respiratory distress. She has no wheezes. She has no rales. She exhibits no tenderness.  Musculoskeletal: She exhibits no edema.  Neurological: She is alert and oriented to person, place, and time.  Psychiatric: She has a normal mood and affect.  Nursing note and vitals reviewed.   BP 116/60 (BP Location: Right Arm, Cuff Size: Large)   Pulse 80   Temp 98.8 F (37.1 C) (Oral)   Resp 16   Ht 5\' 3"  (1.6 m)   Wt 210 lb 9.6 oz (95.5 kg)   LMP 02/27/2018   SpO2 98%   BMI 37.31 kg/m  Wt Readings from Last 3 Encounters:  03/16/18 210 lb 9.6 oz (95.5 kg)  05/04/17 215 lb (97.5 kg)  02/23/16 209 lb 9.6 oz (95.1 kg)   BP Readings from Last 3 Encounters:  03/16/18 116/60  03/14/18 (!) 143/85  05/04/17 104/60     Immunization History  Administered Date(s) Administered  . Hepatitis A, Adult 08/26/2015  . Influenza Whole 07/07/2009, 08/05/2010  . Td 10/25/1999  . Tdap 12/14/2010, 08/26/2015  . Typhoid Inactivated 08/26/2015  . Yellow Fever 08/26/2015    Health Maintenance  Topic Date Due  . HIV Screening  02/21/1986  . PAP SMEAR  06/19/2017  . INFLUENZA VACCINE  04/19/2018    . TETANUS/TDAP  08/25/2025    Lab Results  Component Value Date   WBC 8.4 03/16/2018   HGB 8.7 Repeated and verified X2. (L) 03/16/2018   HCT 27.4 (L) 03/16/2018   PLT 315.0 03/16/2018   GLUCOSE 89 03/13/2018   CHOL 172 06/04/2015   TRIG 96.0 06/04/2015   HDL 40.50 06/04/2015   LDLCALC 112 (H) 06/04/2015   ALT 12 06/04/2015   AST 17 06/04/2015   NA 139 03/13/2018  K 3.8 03/13/2018   CL 109 03/13/2018   CREATININE 0.81 03/13/2018   BUN 6 03/13/2018   CO2 21 (L) 03/13/2018   TSH 0.61 06/04/2015   HGBA1C 5.7 01/07/2009    Lab Results  Component Value Date   TSH 0.61 06/04/2015   Lab Results  Component Value Date   WBC 8.4 03/16/2018   HGB 8.7 Repeated and verified X2. (L) 03/16/2018   HCT 27.4 (L) 03/16/2018   MCV 64.5 Repeated and verified X2. (L) 03/16/2018   PLT 315.0 03/16/2018   Lab Results  Component Value Date   NA 139 03/13/2018   K 3.8 03/13/2018   CO2 21 (L) 03/13/2018   GLUCOSE 89 03/13/2018   BUN 6 03/13/2018   CREATININE 0.81 03/13/2018   BILITOT 0.5 06/04/2015   ALKPHOS 59 06/04/2015   AST 17 06/04/2015   ALT 12 06/04/2015   PROT 7.9 06/04/2015   ALBUMIN 3.7 06/04/2015   CALCIUM 8.8 (L) 03/13/2018   ANIONGAP 9 03/13/2018   GFR 89.66 06/04/2015   Lab Results  Component Value Date   CHOL 172 06/04/2015   Lab Results  Component Value Date   HDL 40.50 06/04/2015   Lab Results  Component Value Date   LDLCALC 112 (H) 06/04/2015   Lab Results  Component Value Date   TRIG 96.0 06/04/2015   Lab Results  Component Value Date   CHOLHDL 4 06/04/2015   Lab Results  Component Value Date   HGBA1C 5.7 01/07/2009         Assessment & Plan:   Problem List Items Addressed This Visit    None    Visit Diagnoses    Anemia, unspecified type    -  Primary   Relevant Orders   CBC with Differential/Platelet (Completed)   IBC panel (Completed)   Ferritin (Completed)   Fecal occult blood, imunochemical   Dyspepsia       Relevant  Medications   omeprazole (PRILOSEC) 20 MG capsule   Other Relevant Orders   H. pylori antibody, IgG (Completed)      I have discontinued Izabell Pendell's furosemide, spironolactone, metoprolol succinate, losartan, and cetirizine. I am also having her start on omeprazole. Additionally, I am having her maintain her multivitamin and vitamin C.  Meds ordered this encounter  Medications  . omeprazole (PRILOSEC) 20 MG capsule    Sig: Take 1 capsule (20 mg total) by mouth daily.    Dispense:  30 capsule    Refill:  3    CMA served as scribe during this visit. History, Physical and Plan performed by medical provider. Documentation and orders reviewed and attested to.  Ann Held, DO

## 2018-03-17 ENCOUNTER — Other Ambulatory Visit: Payer: Self-pay | Admitting: Family Medicine

## 2018-03-17 DIAGNOSIS — D509 Iron deficiency anemia, unspecified: Secondary | ICD-10-CM

## 2018-03-17 DIAGNOSIS — R1013 Epigastric pain: Secondary | ICD-10-CM | POA: Insufficient documentation

## 2018-03-17 DIAGNOSIS — A048 Other specified bacterial intestinal infections: Secondary | ICD-10-CM

## 2018-03-17 DIAGNOSIS — D649 Anemia, unspecified: Secondary | ICD-10-CM | POA: Insufficient documentation

## 2018-03-17 MED ORDER — AMOXICILL-CLARITHRO-LANSOPRAZ PO MISC: Freq: Two times a day (BID) | ORAL | 0 refills | Status: DC

## 2018-03-17 NOTE — Assessment & Plan Note (Signed)
Check labs Check I fob May need heme referral for iron

## 2018-03-17 NOTE — Assessment & Plan Note (Signed)
ppi per orders  Check labs  Diet d/w pt Consider GI

## 2018-03-19 ENCOUNTER — Other Ambulatory Visit: Payer: Self-pay | Admitting: *Deleted

## 2018-03-19 DIAGNOSIS — D509 Iron deficiency anemia, unspecified: Secondary | ICD-10-CM

## 2018-03-19 MED ORDER — AMOXICILLIN 500 MG PO CAPS: 1000.0000 mg | ORAL_CAPSULE | Freq: Two times a day (BID) | ORAL | 0 refills | Status: DC

## 2018-03-19 MED ORDER — LANSOPRAZOLE 30 MG PO CPDR: 30.0000 mg | DELAYED_RELEASE_CAPSULE | Freq: Two times a day (BID) | ORAL | 0 refills | Status: DC

## 2018-03-19 MED ORDER — CLARITHROMYCIN 500 MG PO TABS: 500.0000 mg | ORAL_TABLET | Freq: Two times a day (BID) | ORAL | 0 refills | Status: DC

## 2018-04-09 ENCOUNTER — Other Ambulatory Visit: Payer: 59

## 2018-04-09 ENCOUNTER — Other Ambulatory Visit (INDEPENDENT_AMBULATORY_CARE_PROVIDER_SITE_OTHER): Payer: 59

## 2018-04-09 DIAGNOSIS — D649 Anemia, unspecified: Secondary | ICD-10-CM | POA: Diagnosis not present

## 2018-04-09 LAB — FECAL OCCULT BLOOD, IMMUNOCHEMICAL: Fecal Occult Bld: NEGATIVE

## 2018-04-10 ENCOUNTER — Encounter: Payer: Self-pay | Admitting: Cardiovascular Disease

## 2018-04-10 ENCOUNTER — Ambulatory Visit (INDEPENDENT_AMBULATORY_CARE_PROVIDER_SITE_OTHER): Payer: 59 | Admitting: Cardiovascular Disease

## 2018-04-10 VITALS — BP 122/64 | HR 63 | Ht 63.0 in | Wt 208.4 lb

## 2018-04-10 DIAGNOSIS — R079 Chest pain, unspecified: Secondary | ICD-10-CM | POA: Diagnosis not present

## 2018-04-10 DIAGNOSIS — I5042 Chronic combined systolic (congestive) and diastolic (congestive) heart failure: Secondary | ICD-10-CM | POA: Diagnosis not present

## 2018-04-10 NOTE — Patient Instructions (Signed)
Medication Instructions:  Your physician recommends that you continue on your current medications as directed. Please refer to the Current Medication list given to you today.  Labwork: none  Testing/Procedures: Your physician has requested that you have an echocardiogram. Echocardiography is a painless test that uses sound waves to create images of your heart. It provides your doctor with information about the size and shape of your heart and how well your heart's chambers and valves are working. This procedure takes approximately one hour. There are no restrictions for this procedure. [place] [address]  Follow-Up: Your physician wants you to follow-up in: [place] will receive a reminder letter in the mail two months in advance. If you don't receive a letter, please call our office to schedule the follow-up appointment.  Any Other Special Instructions Will Be Listed Below (If Applicable).  IF YOUR ECHO IS NOT STABLE WILL SEE YOU SOONER THAN 1 YEAR  If you need a refill on your cardiac medications before your next appointment, please call your pharmacy.

## 2018-04-10 NOTE — Progress Notes (Signed)
===   Cardiology Office Note   Date:  04/10/2018   ID:  Danielle Burns, DOB 09-09-1971, MRN [id number]  PCP:  Carollee Herter, Alferd Apa, DO  Cardiologist:   Skeet Latch, MD   Chief Complaint  Patient presents with  . office visit    continued issues with swelling in bilateral legs, and SOB, denies chest pains currently    Patient ID: Danielle Burns is a 47 y.o. female with peripartum cardiomyopathy, chronic systolic and diastolic heart failure, and palpitations who presents for follow up on chronic systolic heart failure.  Ms. Chouinard has a history of peripartum cardiomyopathy that was diagnosed in 2012. At the time her ejection fraction was reportedly in the 30s. She subsequently recovered normal systolic function was dismissed from Dr. Tanna Furry clinic. However she was again seen in clinic 04/2015 due to weight gain and edema. She was referred for an echo 05/05/15 that showed that her LVEF is again reduced to 40-45%.  She was started on metoprolol and lisinopril.  She developed a cough so lisinopril was switched to losartan. Spironolactone was added to her regimen. Her symptoms improved and her BNP was 15.   Since her last appointment Ms. Duffner was seen in the ED 03/13/18 with chest pain.  The episode occurred while working.  She noted chest pressure while at work that seem to improve when she walked.  It was associated with nausea but no shortness of breath or diaphoresis.  The episodes occurred randomly and lasted for a few minutes at a time.  They are recurrent throughout the day.  She went to the ED where cardiac enzymes and EKG were unremarkable.  She followed up with her PCP and tested positive for H. pylori.  Since starting treatment she has not had any recurrent symptoms.  She went to the gym yesterday and lifted weights and walked on the treadmill without any exertional symptoms.  She has mild lower extremity edema at the end of the day after standing on her feet but this improves with elevation of  her legs.  She denies orthopnea or PND.  At her last appointment plans were made for her to continue her heart failure medications.  However she reports that when she went to the pharmacy they had not been sent in.  She has not been on any heart failure medication  in the last year.  Past Medical History:  Diagnosis Date  . Chronic systolic heart failure ([place]) 07/29/2015  . Gestational diabetes   . Palpitations   . Peripartum cardiomyopathy   . Recurrent UTI     Past Surgical History:  Procedure Laterality Date  . TUBAL LIGATION       Current Outpatient Medications  Medication Sig Dispense Refill  . Multiple Vitamin (MULTIVITAMIN) tablet Take 1 tablet by mouth 2 (two) times a week. Reported on 02/20/2016    . omeprazole (PRILOSEC) 20 MG capsule Take 1 capsule (20 mg total) by mouth daily. 30 capsule 3  . vitamin C (ASCORBIC ACID) 500 MG tablet Take 500 mg by mouth daily as needed (when feeling sick).     No current facility-administered medications for this visit.     Allergies:   Patient has no known allergies.    Social History:  The patient  reports that she has never smoked. She has never used smokeless tobacco. She reports that she does not drink alcohol.   Family History:  The patient's family history includes Diabetes in her mother; Heart failure in her mother;  Hypertension in her mother; Parkinsonism in her father.    ROS:  Please see the history of present illness.   Otherwise, review of systems are positive for burning in hands.   All other systems are reviewed and negative.    PHYSICAL EXAM: VS:  BP 122/64 (BP Location: Left Arm, Patient Position: Sitting)   Pulse 63   Ht 5\' 3"  (1.6 m)   Wt 208 lb 6.4 oz (94.5 kg)   BMI 36.92 kg/m  , BMI Body mass index is 36.92 kg/m. GENERAL:  Well appearing HEENT: Pupils equal round and reactive, fundi not visualized, oral mucosa unremarkable NECK:  No jugular venous distention, waveform within normal limits, carotid upstroke  brisk and symmetric, no bruits, no thyromegaly LYMPHATICS:  No cervical adenopathy LUNGS:  Clear to auscultation bilaterally HEART:  RRR.  PMI not displaced or sustained,S1 and S2 within normal limits, no S3, no S4, no clicks, no rubs, no murmurs ABD:  Flat, positive bowel sounds normal in frequency in pitch, no bruits, no rebound, no guarding, no midline pulsatile mass, no hepatomegaly, no splenomegaly EXT:  2 plus pulses throughout, no edema, no cyanosis no clubbing SKIN:  No rashes no nodules NEURO:  Cranial nerves II through XII grossly intact, motor grossly intact throughout PSYCH:  Cognitively intact, oriented to person place and time   EKG:  EKG is ordered today. 05/04/17: Sinus rhythm. Rate 100 bpm. 04/10/2018: Sinus rhythm.  Rate 63 bpm.  TTE 05/2011: EF 55-60%.    TTE 05/05/15: Study Conclusions  - Left ventricle: The cavity size was normal. Wall thickness was normal. Systolic function was mildly reduced. The estimated ejection fraction was in the range of 45% to 50%. Diffuse hypokinesis. Left ventricular diastolic function parameters were normal. - Mitral valve: There was mild regurgitation. - Pericardium, extracardiac: A trivial pericardial effusion was identified.  Treadmill stress test 05/07/15:  Negative adequate ETT.  Recent Labs: 03/13/2018: BUN 6; Creatinine, Ser 0.81; Potassium 3.8; Sodium 139 03/16/2018: Hemoglobin 8.7 Repeated and verified X2.; Platelets 315.0    Lipid Panel    Component Value Date/Time   CHOL 172 06/04/2015 1143   TRIG 96.0 06/04/2015 1143   HDL 40.50 06/04/2015 1143   CHOLHDL 4 06/04/2015 1143   VLDL 19.2 06/04/2015 1143   LDLCALC 112 (H) 06/04/2015 1143      Wt Readings from Last 3 Encounters:  04/10/18 208 lb 6.4 oz (94.5 kg)  03/16/18 210 lb 9.6 oz (95.5 kg)  05/04/17 215 lb (97.5 kg)     Other studies Reviewed: Additional studies/ records that were reviewed today include: n/a. Review of the above records  demonstrates:  Please see elsewhere in the note.     ASSESSMENT AND PLAN:  # Chronic systolic heart failure:  Ms. Detjen is euvolemic.  LVEF 45-50% on echo 04/2015.  She has been off losartan, metoprolol, lasix, and spironolactone for over a year.  We will check an echo.  If her LVEF remains reduced or worse we will need to restart them.    # Chest pain: Atypica and not consistent with ischemia.  Continue H. pylori treatment.  # Palpitations: Stable off metoprolol.    Current medicines are reviewed at length with the patient today.  The patient does not have concerns regarding medicines.  The following changes have been made:    Labs/ tests ordered today include:      Orders Placed This Encounter  Procedures  . EKG 12-Lead  . ECHOCARDIOGRAM COMPLETE     Disposition:  FU with Dr. Lilli Light. Oval Linsey in 1 year.   Signed, Skeet Latch, MD  04/10/2018 10:36 AM    [place]

## 2018-04-18 ENCOUNTER — Other Ambulatory Visit: Payer: Self-pay | Admitting: Family

## 2018-04-18 DIAGNOSIS — D649 Anemia, unspecified: Secondary | ICD-10-CM

## 2018-04-18 DIAGNOSIS — D563 Thalassemia minor: Secondary | ICD-10-CM

## 2018-04-18 DIAGNOSIS — D560 Alpha thalassemia: Secondary | ICD-10-CM

## 2018-04-19 ENCOUNTER — Encounter: Payer: Self-pay | Admitting: Family

## 2018-04-19 ENCOUNTER — Inpatient Hospital Stay: Payer: 59

## 2018-04-19 ENCOUNTER — Inpatient Hospital Stay: Payer: 59 | Attending: Family | Admitting: Family

## 2018-04-19 ENCOUNTER — Other Ambulatory Visit: Payer: Self-pay

## 2018-04-19 VITALS — BP 138/70 | HR 79 | Temp 98.2°F | Resp 18 | Wt 210.0 lb

## 2018-04-19 VITALS — BP 119/73

## 2018-04-19 DIAGNOSIS — D649 Anemia, unspecified: Secondary | ICD-10-CM

## 2018-04-19 DIAGNOSIS — I429 Cardiomyopathy, unspecified: Secondary | ICD-10-CM

## 2018-04-19 DIAGNOSIS — D509 Iron deficiency anemia, unspecified: Secondary | ICD-10-CM | POA: Diagnosis present

## 2018-04-19 DIAGNOSIS — D500 Iron deficiency anemia secondary to blood loss (chronic): Secondary | ICD-10-CM

## 2018-04-19 DIAGNOSIS — D573 Sickle-cell trait: Secondary | ICD-10-CM | POA: Insufficient documentation

## 2018-04-19 DIAGNOSIS — D563 Thalassemia minor: Secondary | ICD-10-CM

## 2018-04-19 DIAGNOSIS — D560 Alpha thalassemia: Secondary | ICD-10-CM

## 2018-04-19 DIAGNOSIS — N920 Excessive and frequent menstruation with regular cycle: Secondary | ICD-10-CM | POA: Diagnosis not present

## 2018-04-19 LAB — CBC WITH DIFFERENTIAL (CANCER CENTER ONLY)
BASOS ABS: 0 10*3/uL (ref 0.0–0.1)
BASOS PCT: 0 %
EOS ABS: 0.5 10*3/uL (ref 0.0–0.5)
EOS PCT: 5 %
HCT: 27.5 % — ABNORMAL LOW (ref 34.8–46.6)
Hemoglobin: 8.4 g/dL — ABNORMAL LOW (ref 11.6–15.9)
Lymphocytes Relative: 32 %
Lymphs Abs: 3.2 10*3/uL (ref 0.9–3.3)
MCH: 19.9 pg — ABNORMAL LOW (ref 26.0–34.0)
MCHC: 30.5 g/dL — ABNORMAL LOW (ref 32.0–36.0)
MCV: 65 fL — AB (ref 81.0–101.0)
Monocytes Absolute: 1 10*3/uL — ABNORMAL HIGH (ref 0.1–0.9)
Monocytes Relative: 10 %
Neutro Abs: 5.4 10*3/uL (ref 1.5–6.5)
Neutrophils Relative %: 53 %
PLATELETS: 359 10*3/uL (ref 145–400)
RBC: 4.23 MIL/uL (ref 3.70–5.32)
RDW: 18.7 % — ABNORMAL HIGH (ref 11.1–15.7)
WBC: 10.1 10*3/uL — AB (ref 3.9–10.0)

## 2018-04-19 LAB — CMP (CANCER CENTER ONLY)
ALBUMIN: 3.6 g/dL (ref 3.5–5.0)
ALT: 14 U/L (ref 0–44)
AST: 18 U/L (ref 15–41)
Alkaline Phosphatase: 72 U/L (ref 38–126)
Anion gap: 7 (ref 5–15)
BUN: 7 mg/dL (ref 6–20)
CHLORIDE: 109 mmol/L (ref 98–111)
CO2: 24 mmol/L (ref 22–32)
Calcium: 8.8 mg/dL — ABNORMAL LOW (ref 8.9–10.3)
Creatinine: 0.77 mg/dL (ref 0.44–1.00)
GFR, Est AFR Am: >60 mL/min (ref >60)
Glucose, Bld: 94 mg/dL (ref 70–99)
POTASSIUM: 4 mmol/L (ref 3.5–5.1)
SODIUM: 140 mmol/L (ref 135–145)
Total Bilirubin: 0.3 mg/dL (ref 0.3–1.2)
Total Protein: 8.1 g/dL (ref 6.5–8.1)

## 2018-04-19 LAB — RETICULOCYTES
RBC.: 4.26 MIL/uL (ref 3.70–5.45)
RETIC CT PCT: 1.7 % (ref 0.7–2.1)
Retic Count, Absolute: 72.4 10*3/uL (ref 33.7–90.7)

## 2018-04-19 LAB — SAVE SMEAR

## 2018-04-19 MED ORDER — SODIUM CHLORIDE 0.9 % IV SOLN
Freq: Once | INTRAVENOUS | Status: AC
  Administered 2018-04-19: 12:00:00 via INTRAVENOUS
  Filled 2018-04-19: qty 250

## 2018-04-19 MED ORDER — SODIUM CHLORIDE 0.9 % IV SOLN
510.0000 mg | Freq: Once | INTRAVENOUS | Status: AC
  Administered 2018-04-19: 510 mg via INTRAVENOUS
  Filled 2018-04-19: qty 17

## 2018-04-19 MED ORDER — FOLIC ACID 1 MG PO TABS: 2.0000 mg | ORAL_TABLET | Freq: Every day | ORAL | 11 refills | Status: DC

## 2018-04-19 NOTE — Progress Notes (Signed)
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 She has failed oral iron. Saturation is down to 4% and ferritin 4. Hgb today is 8.4 with an MCV of 65.  She is symptomatic with fatigue, weakness, SOB with over exertion, chewing ice, dizziness and occasional palpitations.  She has the sickle cell trait as do 3 of her 4 children. We are also checking her for alpha thalassemia.  She has history of 1 miscarriage within the first 8 weeks of pregnancy. She states she had just had her IUD removed. Her last two children were born premature.  Her cycle is regular and quite heavy. No other bleeding, no bruising or petechiae.  She has never had IV iron before and has never required a blood transfusion.  Only surgical history was C-section with no complications during or after.  No fever, chills, n/v, cough, rash, chest pain, abdominal pain or changes in bowel or bladder habits.  She has issues with constipation and takes fiber regularly and will use a laxative if needed.  She was recently treated to H.pylori.  The puffiness in her feet and ankles waxes and wanes. She is followed by cardiology Dr. Oval Linsey for history of cardiomyopathy. She is scheduled to have an ECHO on 8/5 and is currently off her lasix.  Her appetite comes and goes. She is staying well hydrated. Her weight is stable.  She had gestational diabetes but this resolved.  She does not smoke or drink alcohol.  She is doing her best to stay active. She recently joined a gym and has been going to work our with her kids.  She works as  an Medical laboratory scientific officer at [employer].  She moved to the [place] with her sweet family 10 years ago.    ROS: All other 10 point review of systems is negative.   PAST MEDICAL HISTORY:   Past Medical History:  Diagnosis Date  . Chronic systolic heart failure ([place]) 07/29/2015  . Gestational diabetes   . Palpitations   . Peripartum cardiomyopathy   . Recurrent UTI     ALLERGIES: No Known Allergies    MEDICATIONS:  Current Outpatient Medications on File Prior to Visit  Medication Sig Dispense Refill  . Multiple Vitamin (MULTIVITAMIN) tablet Take 1 tablet by mouth 2 (two) times a week. Reported on 02/20/2016    . omeprazole (PRILOSEC) 20 MG capsule Take 1 capsule (20 mg total) by mouth daily. 30 capsule 3  . vitamin C (ASCORBIC ACID) 500 MG tablet Take 500 mg by mouth daily as needed (when feeling sick).     No current facility-administered medications on file prior to visit.      PAST SURGICAL HISTORY Past Surgical History:  Procedure Laterality Date  . TUBAL LIGATION      FAMILY HISTORY: Family History  Problem Relation Age of Onset  . Diabetes Mother        type II  . Hypertension Mother   . Heart failure Mother   . Parkinsonism Father   . Cancer Neg Hx  negative for breast and colon    SOCIAL HISTORY:  reports that she has never smoked. She has never used smokeless tobacco. She reports that she does not drink alcohol. Her drug history is not on file.  PERFORMANCE STATUS: The patient's performance status is 1 - Symptomatic but completely ambulatory  PHYSICAL EXAM: Most Recent Vital Signs: There were no vitals taken for this visit. There were no vitals taken for this visit.  General Appearance:    Alert, cooperative, no distress, appears stated age  Head:    Normocephalic, without obvious abnormality, atraumatic  Eyes:    PERRL, conjunctiva/corneas clear, EOM's intact, fundi    benign, both eyes        Throat:   Lips, mucosa, and tongue normal; teeth and gums normal  Neck:    Supple, symmetrical, trachea midline, no adenopathy;    thyroid:  no enlargement/tenderness/nodules; no carotid   bruit or JVD  Back:     Symmetric, no curvature, ROM normal, no CVA tenderness  Lungs:     Clear to auscultation bilaterally, respirations unlabored  Chest Wall:    No tenderness or deformity   Heart:    Regular rate and rhythm, S1 and S2 normal, no murmur, rub   or gallop     Abdomen:     Soft, non-tender, bowel sounds active all four quadrants,    no masses, no organomegaly        Extremities:   Extremities normal, atraumatic, no cyanosis or edema  Pulses:   2+ and symmetric all extremities  Skin:   Skin color, texture, turgor normal, no rashes or lesions  Lymph nodes:   Cervical, supraclavicular, and axillary nodes normal  Neurologic:   CNII-XII intact, normal strength, sensation and reflexes    throughout    LABORATORY DATA:  Results for orders placed or performed in visit on 04/19/18 (from the past 48 hour(s))  CBC with Differential (Cancer Center Only)     Status: Abnormal   Collection Time: 04/19/18 10:26 AM  Result Value Ref Range   WBC Count 10.1 (H) 3.9 - 10.0 K/uL   RBC 4.23 3.70 - 5.32 MIL/uL   Hemoglobin 8.4 (L) 11.6 - 15.9 g/dL   HCT 27.5 (L) 34.8 - 46.6 %   MCV 65.0 (L) 81.0 - 101.0 fL   MCH 19.9 (L) 26.0 - 34.0 pg   MCHC 30.5 (L) 32.0 - 36.0 g/dL   RDW 18.7 (H) 11.1 - 15.7 %   Platelet Count 359 145 - 400 K/uL   Neutrophils Relative % 53 %   Neutro Abs 5.4 1.5 - 6.5 K/uL   Lymphocytes Relative 32 %   Lymphs Abs 3.2 0.9 - 3.3 K/uL   Monocytes Relative 10 %   Monocytes Absolute 1.0 (H) 0.1 - 0.9 K/uL   Eosinophils Relative 5 %   Eosinophils Absolute 0.5 0.0 - 0.5 K/uL   Basophils Relative 0 %   Basophils Absolute 0.0 0.0 - 0.1 K/uL    Comment: Performed at [facility] at [facility], [address], [place], [place] [address]  Save smear     Status: None   Collection Time: 04/19/18 10:27 AM  Result Value Ref Range    Smear Review SMEAR STAINED AND AVAILABLE FOR REVIEW     Comment: Performed at [facility] at [facility], [address] [place], [place] [address]      RADIOGRAPHY: No results found.  PATHOLOGY: None  ASSESSMENT/PLAN: Ms. Falkenstein is a very pleasant 47 yo Danielle Burns female with iron deficiency anemia secondary to heavy cycles and sickle cell trait. We are also checking her for alpha thalassemia.  We will give her IV iron today and again in 1 weeks.  She will start folic acid 2 mg PO daily.  We will plan to see her back in another 6 weeks for follow-up.   All questions were answered and she is in agreement with there plan. The patient knows to call the clinic with any problems, questions or concerns. We can certainly see the patient much sooner if necessary.  She was discussed with and also seen by Dr. Marin Olp and he is in agreement with the aforementioned.   [name]     Addendum:  I saw and examined Ms. Laker with Judson Roch.  I agree with the above assessment.  She clearly is iron deficient.  She is markedly iron deficient.  I will get her blood under the microscope.  She had significant poikilocytosis.  She had anisocytosis.  She had microcytic and hypochromic red blood cells.  White blood cells appeared normal.  I saw no nucleated red blood cells.  She had no hypersegmented polys.  Platelets were adequate number and size.  We will go ahead and give her IV iron today.  She clearly needs a given a hemoglobin of 8.4.  She has markedly decreased MCV of 65.  Given that she is from [place], we are is doing a hemoglobin electrophoresis on her.  She does have sickle cell trait.  I do not think that she has thalassemia but we can certainly test for this.  We will go ahead and give her a couple doses of IV iron.  This will definitely help.  She does have heavy monthly cycles.  I am sure that this is a big reason for the iron deficiency.  She  apparently was taking oral iron.  She is also on Prilosec so this is why oral iron probably is not working.  We spent about 40 minutes with her.  We spent all the time face-to-face.  We counseled her and coordinated her care for her.  We answered all of her questions.  We will plan to see her back in about 6 weeks or so.  By then, her MCV should be close to 80, if not above and her hemoglobin should be close to 11.  Lattie Haw, MD

## 2018-04-19 NOTE — Patient Instructions (Signed)

## 2018-04-20 LAB — IRON AND TIBC
IRON: 13 ug/dL — AB (ref 41–142)
Saturation Ratios: 3 % — ABNORMAL LOW (ref 21–57)
TIBC: 488 ug/dL — AB (ref 236–444)
UIBC: 474 ug/dL

## 2018-04-20 LAB — FERRITIN

## 2018-04-20 LAB — LACTATE DEHYDROGENASE: LDH: 260 U/L — AB (ref 98–192)

## 2018-04-20 LAB — ERYTHROPOIETIN: Erythropoietin: 215.2 m[IU]/mL — ABNORMAL HIGH (ref 2.6–18.5)

## 2018-04-23 ENCOUNTER — Ambulatory Visit (HOSPITAL_COMMUNITY): Payer: 59 | Attending: Cardiovascular Disease

## 2018-04-23 ENCOUNTER — Other Ambulatory Visit: Payer: Self-pay

## 2018-04-23 DIAGNOSIS — I5042 Chronic combined systolic (congestive) and diastolic (congestive) heart failure: Secondary | ICD-10-CM

## 2018-04-23 DIAGNOSIS — I071 Rheumatic tricuspid insufficiency: Secondary | ICD-10-CM | POA: Insufficient documentation

## 2018-04-23 DIAGNOSIS — R079 Chest pain, unspecified: Secondary | ICD-10-CM | POA: Insufficient documentation

## 2018-04-24 ENCOUNTER — Inpatient Hospital Stay: Payer: 59

## 2018-04-24 ENCOUNTER — Other Ambulatory Visit: Payer: Self-pay

## 2018-04-24 ENCOUNTER — Ambulatory Visit: Payer: 59

## 2018-04-24 VITALS — BP 113/75 | HR 73 | Temp 98.3°F | Resp 18

## 2018-04-24 DIAGNOSIS — D500 Iron deficiency anemia secondary to blood loss (chronic): Secondary | ICD-10-CM

## 2018-04-24 DIAGNOSIS — D509 Iron deficiency anemia, unspecified: Secondary | ICD-10-CM | POA: Diagnosis not present

## 2018-04-24 MED ORDER — SODIUM CHLORIDE 0.9 % IV SOLN
510.0000 mg | Freq: Once | INTRAVENOUS | Status: AC
  Administered 2018-04-24: 510 mg via INTRAVENOUS
  Filled 2018-04-24: qty 17

## 2018-04-24 MED ORDER — SODIUM CHLORIDE 0.9 % IV SOLN
Freq: Once | INTRAVENOUS | Status: AC
  Administered 2018-04-24: 11:00:00 via INTRAVENOUS
  Filled 2018-04-24: qty 250

## 2018-04-24 NOTE — Patient Instructions (Signed)

## 2018-04-25 ENCOUNTER — Inpatient Hospital Stay: Payer: 59

## 2018-05-01 ENCOUNTER — Ambulatory Visit: Payer: 59

## 2018-05-07 LAB — ALPHA-THALASSEMIA GENOTYPR

## 2018-05-30 ENCOUNTER — Encounter: Payer: Self-pay | Admitting: Family

## 2018-05-30 ENCOUNTER — Inpatient Hospital Stay: Payer: 59 | Attending: Family | Admitting: Family

## 2018-05-30 ENCOUNTER — Inpatient Hospital Stay: Payer: 59

## 2018-05-30 ENCOUNTER — Other Ambulatory Visit: Payer: Self-pay

## 2018-05-30 ENCOUNTER — Other Ambulatory Visit: Payer: 59 | Admitting: Family

## 2018-05-30 VITALS — BP 112/62 | HR 70 | Temp 98.5°F | Resp 17 | Wt 211.8 lb

## 2018-05-30 DIAGNOSIS — D573 Sickle-cell trait: Secondary | ICD-10-CM

## 2018-05-30 DIAGNOSIS — N920 Excessive and frequent menstruation with regular cycle: Secondary | ICD-10-CM | POA: Diagnosis not present

## 2018-05-30 DIAGNOSIS — D500 Iron deficiency anemia secondary to blood loss (chronic): Secondary | ICD-10-CM

## 2018-05-30 DIAGNOSIS — D509 Iron deficiency anemia, unspecified: Secondary | ICD-10-CM | POA: Insufficient documentation

## 2018-05-30 LAB — CBC WITH DIFFERENTIAL (CANCER CENTER ONLY)
BASOS ABS: 0 10*3/uL (ref 0.0–0.1)
Basophils Relative: 0 %
Eosinophils Absolute: 0.2 10*3/uL (ref 0.0–0.5)
Eosinophils Relative: 3 %
HEMATOCRIT: 34.2 % — AB (ref 34.8–46.6)
Hemoglobin: 11.1 g/dL — ABNORMAL LOW (ref 11.6–15.9)
LYMPHS PCT: 36 %
Lymphs Abs: 2.3 10*3/uL (ref 0.9–3.3)
MCH: 24.9 pg — ABNORMAL LOW (ref 26.0–34.0)
MCHC: 32.5 g/dL (ref 32.0–36.0)
MCV: 76.9 fL — ABNORMAL LOW (ref 81.0–101.0)
MONO ABS: 0.5 10*3/uL (ref 0.1–0.9)
Monocytes Relative: 8 %
NEUTROS ABS: 3.4 10*3/uL (ref 1.5–6.5)
NEUTROS PCT: 53 %
Platelet Count: 420 10*3/uL — ABNORMAL HIGH (ref 145–400)
RBC: 4.45 MIL/uL (ref 3.70–5.32)
WBC: 6.5 10*3/uL (ref 3.9–10.0)

## 2018-05-30 LAB — RETICULOCYTES
RBC.: 4.42 MIL/uL (ref 3.70–5.45)
Retic Count, Absolute: 66.3 10*3/uL (ref 33.7–90.7)
Retic Ct Pct: 1.5 % (ref 0.7–2.1)

## 2018-05-30 NOTE — Progress Notes (Signed)
Hematology and Oncology Follow Up Visit  Danielle Burns [id number] [birth year] 47 y.o. 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 Danielle Burns is here today for follow-up. She tolerated treatment with Iv iron in August well and is feeling much better. Her Hgb is now up to 11.1 with an MCV of 76.  Her energy has improved and she is no longer craving ice.  Her cycle is still heavy and regular. No other bleeding, no bruising or petechiae.  No fever, chills, n/v, cough, rash, dizziness, SOB, chest pain, palpitations, abdominal pain or changes in bowel or bladder habits.  She takes Metamucil for constipation as needed.  She has occasional numbness and tingling in her hands and feet.  Puffiness in her hands and feet comes and goes. No lymphadenopathy noted on exam.  She has maintained a good appetite and is staying well hydrated. Her weight is stable.   ECOG Performance Status: 1 - Symptomatic but completely ambulatory  Medications:  Allergies as of 05/30/2018   No Known Allergies     Medication List        Accurate as of 05/30/18 11:46 AM. Always use your most recent med list.          folic acid 1 MG tablet Commonly known as:  FOLVITE Take 2 tablets (2 mg total) by mouth daily.   multivitamin tablet Take 1 tablet by mouth 2 (two) times a week. Reported on 02/20/2016   omeprazole 20 MG capsule Commonly known as:  PRILOSEC Take 1 capsule (20 mg total) by mouth daily.   vitamin C 500 MG tablet Commonly known as:  ASCORBIC ACID Take 500 mg by mouth daily as needed (when feeling sick).       Allergies: No Known Allergies  Past Medical History, Surgical history, Social history, and Family History were reviewed and updated.  Review of Systems: All other 10 point review of systems is negative.   Physical Exam:  weight is 211 lb  12.8 oz (96.1 kg). Her oral temperature is 98.5 F (36.9 C). Her blood pressure is 112/62 and her pulse is 70. Her respiration is 17 and oxygen saturation is 100%.   Wt Readings from Last 3 Encounters:  05/30/18 211 lb 12.8 oz (96.1 kg)  04/19/18 210 lb (95.3 kg)  04/10/18 208 lb 6.4 oz (94.5 kg)    Ocular: Sclerae unicteric, pupils equal, round and reactive to light Ear-nose-throat: Oropharynx clear, dentition fair Lymphatic: No cervical, supraclavicular or axillary adenopathy Lungs no rales or rhonchi, good excursion bilaterally Heart regular rate and rhythm, no murmur appreciated Abd soft, nontender, positive bowel sounds, no liver or spleen tip palpated on exam, no fluid wave  MSK no focal spinal tenderness, no joint edema Neuro: non-focal, well-oriented, appropriate affect Breasts: Deferred   Lab Results  Component Value Date   WBC 6.5 05/30/2018   HGB 11.1 (L) 05/30/2018   HCT 34.2 (L) 05/30/2018   MCV 76.9 (L) 05/30/2018   PLT 420 (H) 05/30/2018   Lab Results  Component Value Date   FERRITIN <4 (L) 04/19/2018   IRON 13 (L) 04/19/2018   TIBC 488 (H) 04/19/2018   UIBC 474 04/19/2018   IRONPCTSAT 3 (L) 04/19/2018   Lab Results  Component Value Date   RETICCTPCT 1.7 04/19/2018   RBC 4.45 05/30/2018   No results found for:  KPAFRELGTCHN, LAMBDASER, KAPLAMBRATIO No results found for: IGGSERUM, IGA, IGMSERUM No results found for: Odetta Pink, SPEI   Chemistry      Component Value Date/Time   NA 140 04/19/2018 1026   NA 140 05/04/2017 1503   K 4.0 04/19/2018 1026   CL 109 04/19/2018 1026   CO2 24 04/19/2018 1026   BUN 7 04/19/2018 1026   BUN 12 05/04/2017 1503   CREATININE 0.77 04/19/2018 1026   CREATININE 0.75 09/03/2015 1542      Component Value Date/Time   CALCIUM 8.8 (L) 04/19/2018 1026   ALKPHOS 72 04/19/2018 1026   AST 18 04/19/2018 1026   ALT 14 04/19/2018 1026   BILITOT 0.3 04/19/2018 1026       Impression and Plan: [name] is a very pleasant 47 yo [name] with iron deficeincy anemia secondary to heavy cycles and the sickle cell trait.  She has responded nicely to the IV iron she received in August. We will see what her iron studies show and bring her back in for infusion if needed.  She will continue to take her folic acid daily.  We will plan to see her back in another 3 months for follow-up.  She will contact our office with any questions or concerns. We can certainly see her sooner if need be.   [name], NP 9/11/201911:46 AM

## 2018-05-31 LAB — IRON AND TIBC
IRON: 91 ug/dL (ref 41–142)
SATURATION RATIOS: 29 % (ref 21–57)
TIBC: 310 ug/dL (ref 236–444)
UIBC: 219 ug/dL

## 2018-05-31 LAB — FERRITIN: FERRITIN: 81 ng/mL (ref 11–307)

## 2018-06-12 ENCOUNTER — Other Ambulatory Visit: Payer: 59

## 2018-06-12 ENCOUNTER — Ambulatory Visit: Payer: 59 | Admitting: Family

## 2018-07-13 ENCOUNTER — Encounter: Payer: Self-pay | Admitting: Physician Assistant

## 2018-07-13 ENCOUNTER — Other Ambulatory Visit: Payer: Self-pay

## 2018-07-13 ENCOUNTER — Ambulatory Visit (INDEPENDENT_AMBULATORY_CARE_PROVIDER_SITE_OTHER): Payer: 59 | Admitting: Physician Assistant

## 2018-07-13 VITALS — BP 123/85 | HR 82 | Temp 98.0°F | Resp 18 | Ht 62.6 in | Wt 211.8 lb

## 2018-07-13 DIAGNOSIS — J029 Acute pharyngitis, unspecified: Secondary | ICD-10-CM | POA: Diagnosis not present

## 2018-07-13 DIAGNOSIS — R52 Pain, unspecified: Secondary | ICD-10-CM | POA: Diagnosis not present

## 2018-07-13 LAB — POCT INFLUENZA A/B
Influenza A, POC: NEGATIVE
Influenza B, POC: NEGATIVE

## 2018-07-13 LAB — POCT RAPID STREP A (OFFICE): RAPID STREP A SCREEN: NEGATIVE

## 2018-07-13 NOTE — Patient Instructions (Addendum)
-   We will treat this as a viral infection.  - I recommend you rest, drink plenty of fluids, eat light meals including soups.  - You may also use Tylenol or ibuprofen over-the-counter for your sore throat. Tea recipe for sore throat: boil water, add 2 inches shaved ginger root, steep 15 minutes, add juice from 2 full lemons, and 2 tbsp honey. - Please let me know if you are not seeing any improvement or get worse in 5-7 days.    If you have lab work done today you will be contacted with your lab results within the next 2 weeks.  If you have not heard from [place] then please contact us. The fastest way to get your results is to register for My Chart.   Pharyngitis Pharyngitis is a sore throat (pharynx). There is redness, pain, and swelling of your throat. Follow these instructions at home:  Drink enough fluids to keep your pee (urine) clear or pale yellow.  Only take medicine as told by your doctor. ? You may get sick again if you do not take medicine as told. Finish your medicines, even if you start to feel better. ? Do not take aspirin.  Rest.  Rinse your mouth (gargle) with salt water ( tsp of salt per 1 qt of water) every 1-2 hours. This will help the pain.  If you are not at risk for choking, you can suck on hard candy or sore throat lozenges. Contact a doctor if:  You have large, tender lumps on your neck.  You have a rash.  You cough up green, yellow-brown, or bloody spit. Get help right away if:  You have a stiff neck.  You drool or cannot swallow liquids.  You throw up (vomit) or are not able to keep medicine or liquids down.  You have very bad pain that does not go away with medicine.  You have problems breathing (not from a stuffy nose). This information is not intended to replace advice given to you by your health care provider. Make sure you discuss any questions you have with your health care provider. Document Released: 02/22/2008 Document Revised: 02/11/2016  Document Reviewed: 05/13/2013 Elsevier Interactive Patient Education  2017 [employer].  IF you received an x-ray today, you will receive an invoice from [facility]. Please contact [facility] at [phone] with questions or concerns regarding your invoice.   IF you received labwork today, you will receive an invoice from [facility]. Please contact [facility] at [phone] with questions or concerns regarding your invoice.   Our billing staff will not be able to assist you with questions regarding bills from these companies.  You will be contacted with the lab results as soon as they are available. The fastest way to get your results is to activate your My Chart account. Instructions are located on the last page of this paperwork. If you have not heard from [place] regarding the results in 2 weeks, please contact this office.

## 2018-07-13 NOTE — Progress Notes (Signed)
MRN: [id number] DOB: [birth year]  Subjective:   Danielle Burns is a 47 y.o. female presenting for chief complaint of Generalized Body Aches (X  2 days) and Fever (last night) .  Reports 2 day sudden onset history of sore throat, myalgia and fever (100.5 max). Has tried ibuprofen with no full relief. Denies fatigue,  sinus congestion, sinus pain, rhinorrhea, ear pain, wheezing, shortness of breath, chest tightness and chest pain, night sweats, nausea, vomiting, abdominal pain and diarrhea. Has had sick contact with people at work. Has history of seasonal allergies, no history of asthma. Patient has had flu shot this season. Denies smoking. Denies recent travel. Denies any other aggravating or relieving factors, no other questions or concerns.  Danielle Burns has a current medication list which includes the following prescription(s): folic acid, multivitamin, omeprazole, and vitamin c. Also has No Known Allergies.  Danielle Burns  has a past medical history of Chronic systolic heart failure ([place]) (07/29/2015), Gestational diabetes, Palpitations, Peripartum cardiomyopathy, and Recurrent UTI. Also  has a past surgical history that includes Tubal ligation.   Objective:   Vitals: BP 123/85   Pulse 82   Temp 98 F (36.7 C) (Oral)   Resp 18   Ht 5' 2.6" (1.59 m)   Wt 211 lb 12.8 oz (96.1 kg)   LMP 07/11/2018 (Exact Date)   SpO2 98%   BMI 38.00 kg/m   Physical Exam  Constitutional: She is oriented to person, place, and time. She appears well-developed and well-nourished. No distress.  HENT:  Head: Normocephalic and atraumatic.  Right Ear: Tympanic membrane, external ear and ear canal normal.  Left Ear: Tympanic membrane, external ear and ear canal normal.  Nose: No mucosal edema or rhinorrhea. Right sinus exhibits no maxillary sinus tenderness and no frontal sinus tenderness. Left sinus exhibits no maxillary sinus tenderness and no frontal sinus tenderness.  Mouth/Throat: Uvula is midline and mucous  membranes are normal. Posterior oropharyngeal erythema present. No posterior oropharyngeal edema or tonsillar abscesses. Tonsils are 1+ on the right. Tonsils are 1+ on the left. No tonsillar exudate.  Eyes: Conjunctivae are normal.  Neck: Normal range of motion.  Cardiovascular: Normal rate, regular rhythm, normal heart sounds and intact distal pulses.  Pulmonary/Chest: Effort normal and breath sounds normal. She has no decreased breath sounds. She has no wheezes. She has no rhonchi. She has no rales.  Lymphadenopathy:       Head (right side): No submental, no submandibular, no tonsillar, no preauricular, no posterior auricular and no occipital adenopathy present.       Head (left side): No submental, no submandibular, no tonsillar, no preauricular, no posterior auricular and no occipital adenopathy present.    She has no cervical adenopathy.       Right: No supraclavicular adenopathy present.       Left: No supraclavicular adenopathy present.  Neurological: She is alert and oriented to person, place, and time.  Skin: Skin is warm and dry.  Psychiatric: She has a normal mood and affect.  Vitals reviewed.   Results for orders placed or performed in visit on 07/13/18 (from the past 24 hour(s))  POCT Influenza A/B     Status: None   Collection Time: 07/13/18  3:44 PM  Result Value Ref Range   Influenza A, POC Negative Negative   Influenza B, POC Negative Negative  POCT rapid strep A     Status: None   Collection Time: 07/13/18  3:51 PM  Result Value Ref Range   Rapid  Strep A Screen Negative Negative    Assessment and Plan :  1. Acute pharyngitis, unspecified etiology - Likely viral in etiology d/t reassuring physical exam findings and labs. - Advised supportive care, offered symptomatic relief. - Return to clinic if symptoms worsen or fail to improve in 5-7 days, otherwise return to clinic as needed. 2. Generalized body aches - POCT Influenza A/B 3. Sore throat - POCT rapid strep  A - Culture, Group A Strep    Tenna Delaine, PA-C  [facility] 07/13/2018 3:55 PM

## 2018-07-14 ENCOUNTER — Other Ambulatory Visit: Payer: Self-pay

## 2018-07-14 ENCOUNTER — Emergency Department (HOSPITAL_BASED_OUTPATIENT_CLINIC_OR_DEPARTMENT_OTHER): Payer: No Typology Code available for payment source

## 2018-07-14 ENCOUNTER — Emergency Department (HOSPITAL_BASED_OUTPATIENT_CLINIC_OR_DEPARTMENT_OTHER)
Admission: EM | Admit: 2018-07-14 | Discharge: 2018-07-14 | Disposition: A | Discharge status: Home / Self Care | Payer: No Typology Code available for payment source | Attending: Emergency Medicine | Admitting: Emergency Medicine

## 2018-07-14 ENCOUNTER — Encounter (HOSPITAL_BASED_OUTPATIENT_CLINIC_OR_DEPARTMENT_OTHER): Payer: Self-pay | Admitting: Emergency Medicine

## 2018-07-14 DIAGNOSIS — Y999 Unspecified external cause status: Secondary | ICD-10-CM | POA: Diagnosis not present

## 2018-07-14 DIAGNOSIS — S199XXA Unspecified injury of neck, initial encounter: Secondary | ICD-10-CM | POA: Diagnosis present

## 2018-07-14 DIAGNOSIS — S161XXA Strain of muscle, fascia and tendon at neck level, initial encounter: Secondary | ICD-10-CM | POA: Insufficient documentation

## 2018-07-14 DIAGNOSIS — Y9389 Activity, other specified: Secondary | ICD-10-CM | POA: Insufficient documentation

## 2018-07-14 DIAGNOSIS — E119 Type 2 diabetes mellitus without complications: Secondary | ICD-10-CM | POA: Diagnosis not present

## 2018-07-14 DIAGNOSIS — I5022 Chronic systolic (congestive) heart failure: Secondary | ICD-10-CM | POA: Diagnosis not present

## 2018-07-14 DIAGNOSIS — Z79899 Other long term (current) drug therapy: Secondary | ICD-10-CM | POA: Diagnosis not present

## 2018-07-14 DIAGNOSIS — Y92410 Unspecified street and highway as the place of occurrence of the external cause: Secondary | ICD-10-CM | POA: Insufficient documentation

## 2018-07-14 DIAGNOSIS — M542 Cervicalgia: Secondary | ICD-10-CM | POA: Diagnosis not present

## 2018-07-14 MED ORDER — DIAZEPAM 5 MG PO TABS: 5.0000 mg | ORAL_TABLET | Freq: Two times a day (BID) | ORAL | 0 refills | Status: DC

## 2018-07-14 MED ORDER — KETOROLAC TROMETHAMINE 30 MG/ML IJ SOLN
30.0000 mg | Freq: Once | INTRAMUSCULAR | Status: AC
  Administered 2018-07-14: 30 mg via INTRAMUSCULAR
  Filled 2018-07-14: qty 1
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
## 2018-07-15 LAB — CULTURE, GROUP A STREP: STREP A CULTURE: NEGATIVE

## 2018-07-26 ENCOUNTER — Ambulatory Visit (HOSPITAL_BASED_OUTPATIENT_CLINIC_OR_DEPARTMENT_OTHER)
Admission: RE | Admit: 2018-07-26 | Discharge: 2018-07-26 | Disposition: A | Discharge status: Home / Self Care | Payer: 59 | Source: Ambulatory Visit | Attending: Family Medicine | Admitting: Family Medicine

## 2018-07-26 ENCOUNTER — Encounter: Payer: Self-pay | Admitting: Family Medicine

## 2018-07-26 ENCOUNTER — Ambulatory Visit (INDEPENDENT_AMBULATORY_CARE_PROVIDER_SITE_OTHER): Payer: 59 | Admitting: Family Medicine

## 2018-07-26 VITALS — BP 115/65 | HR 73 | Temp 98.9°F | Resp 16 | Ht 62.6 in | Wt 212.0 lb

## 2018-07-26 DIAGNOSIS — S161XXA Strain of muscle, fascia and tendon at neck level, initial encounter: Secondary | ICD-10-CM

## 2018-07-26 DIAGNOSIS — M25561 Pain in right knee: Secondary | ICD-10-CM | POA: Diagnosis present

## 2018-07-26 DIAGNOSIS — M1711 Unilateral primary osteoarthritis, right knee: Secondary | ICD-10-CM | POA: Insufficient documentation

## 2018-07-26 MED ORDER — METHOCARBAMOL 500 MG PO TABS: 500.0000 mg | ORAL_TABLET | Freq: Four times a day (QID) | ORAL | 1 refills | Status: DC | PRN

## 2018-07-26 MED ORDER — NAPROXEN 500 MG PO TABS: 500.0000 mg | ORAL_TABLET | Freq: Two times a day (BID) | ORAL | 0 refills | Status: DC
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
## 2018-07-30 ENCOUNTER — Other Ambulatory Visit: Payer: Self-pay | Admitting: Family Medicine

## 2018-07-30 DIAGNOSIS — M1710 Unilateral primary osteoarthritis, unspecified knee: Secondary | ICD-10-CM

## 2018-08-16 ENCOUNTER — Other Ambulatory Visit: Payer: Self-pay

## 2018-08-16 ENCOUNTER — Emergency Department (HOSPITAL_BASED_OUTPATIENT_CLINIC_OR_DEPARTMENT_OTHER): Payer: 59

## 2018-08-16 ENCOUNTER — Emergency Department (HOSPITAL_BASED_OUTPATIENT_CLINIC_OR_DEPARTMENT_OTHER)
Admission: EM | Admit: 2018-08-16 | Discharge: 2018-08-16 | Disposition: A | Discharge status: Home / Self Care | Payer: 59 | Attending: Emergency Medicine | Admitting: Emergency Medicine

## 2018-08-16 ENCOUNTER — Encounter (HOSPITAL_BASED_OUTPATIENT_CLINIC_OR_DEPARTMENT_OTHER): Payer: Self-pay | Admitting: *Deleted

## 2018-08-16 DIAGNOSIS — Z23 Encounter for immunization: Secondary | ICD-10-CM | POA: Diagnosis not present

## 2018-08-16 DIAGNOSIS — S91111A Laceration without foreign body of right great toe without damage to nail, initial encounter: Secondary | ICD-10-CM | POA: Diagnosis not present

## 2018-08-16 DIAGNOSIS — Y939 Activity, unspecified: Secondary | ICD-10-CM | POA: Diagnosis not present

## 2018-08-16 DIAGNOSIS — Z79899 Other long term (current) drug therapy: Secondary | ICD-10-CM | POA: Diagnosis not present

## 2018-08-16 DIAGNOSIS — Y999 Unspecified external cause status: Secondary | ICD-10-CM | POA: Diagnosis not present

## 2018-08-16 DIAGNOSIS — S99921A Unspecified injury of right foot, initial encounter: Secondary | ICD-10-CM | POA: Diagnosis not present

## 2018-08-16 DIAGNOSIS — W228XXA Striking against or struck by other objects, initial encounter: Secondary | ICD-10-CM | POA: Insufficient documentation

## 2018-08-16 DIAGNOSIS — I5022 Chronic systolic (congestive) heart failure: Secondary | ICD-10-CM | POA: Diagnosis not present

## 2018-08-16 DIAGNOSIS — Y929 Unspecified place or not applicable: Secondary | ICD-10-CM | POA: Insufficient documentation

## 2018-08-16 MED ORDER — TETANUS-DIPHTH-ACELL PERTUSSIS 5-2.5-18.5 LF-MCG/0.5 IM SUSP
0.5000 mL | Freq: Once | INTRAMUSCULAR | Status: AC
  Administered 2018-08-16: 0.5 mL via INTRAMUSCULAR
  Filled 2018-08-16: qty 0.5

## 2018-08-16 MED ORDER — LIDOCAINE HCL (PF) 1 % IJ SOLN
5.0000 mL | Freq: Once | INTRAMUSCULAR | Status: AC
  Administered 2018-08-16: 5 mL
  Filled 2018-08-16: qty 5

## 2018-08-16 MED ORDER — CEPHALEXIN 250 MG PO CAPS: 250.0000 mg | ORAL_CAPSULE | Freq: Four times a day (QID) | ORAL | 0 refills | Status: DC

## 2018-08-16 NOTE — ED Triage Notes (Addendum)
She slipped and hit her right great toe on a step. Bleeding controlled. Laceration.

## 2018-08-16 NOTE — ED Provider Notes (Signed)
[facility] EMERG[facility] DEPARTMENT Provider Note   CSN: [id number] Arrival date & time: 08/16/18  1336     History   Chief Complaint Chief Complaint  Patient presents with  . Toe Injury    HPI Danielle Burns is a 47 y.o. female.  Patient walking in slippers, stubbed right great toe on concrete step. Laceration noted to plantar surface of foot at base of the great toe.  The history is provided by the patient. No language interpreter was used.  Laceration   The incident occurred 6 to 12 hours ago. The laceration is located on the right foot. Injury mechanism: stubbed toe on concrete step. The pain is mild. The pain has been intermittent since onset. She reports no foreign bodies present.    Past Medical History:  Diagnosis Date  . Chronic systolic heart failure ([place]) 07/29/2015  . Gestational diabetes   . Palpitations   . Peripartum cardiomyopathy   . Recurrent UTI     Patient Active Problem List   Diagnosis Date Noted  . IDA (iron deficiency anemia) 04/19/2018  . Anemia 03/17/2018  . Dyspepsia 03/17/2018  . Chronic systolic heart failure ([place]) 07/29/2015  . Fever of unknown origin 06/04/2015  . Pharyngitis 07/31/2014  . Viral syndrome 07/31/2014  . UTI (urinary tract infection) 07/31/2014  . Otitis media 07/04/2012  . Preventative health care 12/14/2010  . Allergic rhinitis 12/14/2010  . RESTLESS LEG SYNDROME 10/18/2010  . KNEE PAIN, LEFT 10/18/2010  . 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  . PALPITATIONS 01/02/2009  . DIABETES MELLITUS, GESTATIONAL, HX OF 12/26/2007    Past Surgical History:  Procedure Laterality Date  . TUBAL LIGATION       OB History   None      Home Medications    Prior to Admission medications   Medication Sig Start Date End Date Taking? Authorizing Provider  diazepam (VALIUM) 5 MG tablet Take 1 tablet (5 mg total) by mouth 2 (two) times daily. 07/14/18   Isla Pence, MD  folic acid (FOLVITE) 1  MG tablet Take 2 tablets (2 mg total) by mouth daily. 04/19/18   [place], Holli Humbles, NP  methocarbamol (ROBAXIN) 500 MG tablet Take 1 tablet (500 mg total) by mouth every 6 (six) hours as needed for muscle spasms. 07/26/18   Ann Held, DO  Multiple Vitamin (MULTIVITAMIN) tablet Take 1 tablet by mouth 2 (two) times a week. Reported on 02/20/2016    [provider]  naproxen (NAPROSYN) 500 MG tablet Take 1 tablet (500 mg total) by mouth 2 (two) times daily with a meal. 07/26/18   Carollee Herter, Alferd Apa, DO  omeprazole (PRILOSEC) 20 MG capsule Take 1 capsule (20 mg total) by mouth daily. 03/16/18   Ann Held, DO  vitamin C (ASCORBIC ACID) 500 MG tablet Take 500 mg by mouth daily as needed (when feeling sick).    [provider]    Family History Family History  Problem Relation Age of Onset  . Diabetes Mother        type II  . Hypertension Mother   . Heart failure Mother   . Parkinsonism Father   . Cancer Neg Hx        negative for breast and colon    Social History Social History   Tobacco Use  . Smoking status: Never Smoker  . Smokeless tobacco: Never Used  Substance Use Topics  . Alcohol use: No  . Drug use: Never  Allergies   Patient has no known allergies.   Review of Systems Review of Systems  All other systems reviewed and are negative.    Physical Exam Updated Vital Signs BP 122/74   Pulse 66   Temp 98.4 F (36.9 C)   Resp 18   Ht 5\' 2"  (1.575 m)   Wt 96.1 kg   LMP 08/02/2018   SpO2 99%   BMI 38.75 kg/m   Physical Exam  Constitutional: She is oriented to person, place, and time. She appears well-developed and well-nourished.  HENT:  Head: Normocephalic.  Eyes: Conjunctivae are normal.  Cardiovascular: Normal rate and regular rhythm.  Pulmonary/Chest: Effort normal and breath sounds normal.  Abdominal: Soft.  Musculoskeletal: Normal range of motion. She exhibits tenderness.  Neurological: She is alert and  oriented to person, place, and time.  Skin: Skin is warm and dry.  Psychiatric: She has a normal mood and affect.  Nursing note and vitals reviewed.    ED Treatments / Results  Labs (all labs ordered are listed, but only abnormal results are displayed) Labs Reviewed - No data to display  EKG None  Radiology Dg Toe Great Right  Result Date: 08/16/2018 CLINICAL DATA:  Tripped last night and stubbed RIGHT great toe on a concrete step, pain and tenderness since EXAM: RIGHT GREAT TOE COMPARISON:  None FINDINGS: Osseous mineralization normal. Joint spaces preserved. No fracture, dislocation, or bone destruction. IMPRESSION: Normal exam. Electronically Signed   By: Lavonia Dana M.D.   On: 08/16/2018 14:00    Procedures .Marland KitchenLaceration Repair Date/Time: 08/16/2018 3:51 PM Performed by: Etta Quill, NP Authorized by: Etta Quill, NP   Consent:    Consent obtained:  Verbal   Consent given by:  Patient   Risks discussed:  Infection, pain and poor wound healing   Alternatives discussed:  No treatment Anesthesia (see MAR for exact dosages):    Anesthesia method:  Local infiltration   Local anesthetic:  Lidocaine 1% w/o epi Laceration details:    Location:  Foot   Foot location:  Sole of R foot   Length (cm):  2.5 Repair type:    Repair type:  Simple Pre-procedure details:    Preparation:  Patient was prepped and draped in usual sterile fashion Exploration:    Hemostasis achieved with:  Direct pressure   Wound exploration: wound explored through full range of motion     Contaminated: no   Treatment:    Area cleansed with:  Betadine and saline   Amount of cleaning:  Standard   Irrigation solution:  Sterile saline   Irrigation method:  Syringe Skin repair:    Repair method:  Sutures   Suture size:  4-0   Suture material:  Prolene Post-procedure details:    Dressing:  Antibiotic ointment and sterile dressing   Patient tolerance of procedure:  Tolerated well, no immediate  complications   (including critical care time)  Medications Ordered in ED Medications - No data to display   Initial Impression / Assessment and Plan / ED Course  I have reviewed the triage vital signs and the nursing notes.  Pertinent labs & imaging results that were available during my care of the patient were reviewed by me and considered in my medical decision making (see chart for details).     T[place] updated in ED. Laceration occurred < 12 hours prior to repair. Discussed laceration care with pt and answered questions. Pt to f-u for suture removal in 10 days and wound check sooner  should there be signs of dehiscence or infection. Pt is hemodynamically stable with no complaints prior to dc.    Final Clinical Impressions(s) / ED Diagnoses   Final diagnoses:  Laceration of right great toe without foreign body present or damage to nail, initial encounter    ED Discharge Orders         Ordered    cephALEXin (KEFLEX) 250 MG capsule  4 times daily     08/16/18 1551           Etta Quill, NP 08/16/18 1553    Davonna Belling, MD 08/17/18 [phone]

## 2018-08-16 NOTE — ED Notes (Signed)
Toe is bandaged and Pt. Has post op shoe on the R foot.

## 2018-08-16 NOTE — Discharge Instructions (Signed)
Suture removal in 10 days

## 2018-08-27 DIAGNOSIS — M25561 Pain in right knee: Secondary | ICD-10-CM | POA: Diagnosis not present

## 2018-08-27 DIAGNOSIS — M170 Bilateral primary osteoarthritis of knee: Secondary | ICD-10-CM | POA: Diagnosis not present

## 2018-08-27 DIAGNOSIS — M1711 Unilateral primary osteoarthritis, right knee: Secondary | ICD-10-CM | POA: Diagnosis not present

## 2018-08-29 ENCOUNTER — Inpatient Hospital Stay: Payer: 59 | Admitting: Family

## 2018-08-29 ENCOUNTER — Inpatient Hospital Stay: Payer: 59 | Attending: Family

## 2018-11-01 ENCOUNTER — Ambulatory Visit (INDEPENDENT_AMBULATORY_CARE_PROVIDER_SITE_OTHER): Payer: Commercial Managed Care - PPO | Admitting: Family Medicine

## 2018-11-01 ENCOUNTER — Encounter: Payer: Self-pay | Admitting: Family Medicine

## 2018-11-01 VITALS — BP 108/62 | HR 98 | Temp 99.5°F | Ht 63.0 in | Wt 205.2 lb

## 2018-11-01 DIAGNOSIS — R3989 Other symptoms and signs involving the genitourinary system: Secondary | ICD-10-CM | POA: Diagnosis not present

## 2018-11-01 DIAGNOSIS — R6889 Other general symptoms and signs: Secondary | ICD-10-CM

## 2018-11-01 LAB — POC URINALSYSI DIPSTICK (AUTOMATED)
Bilirubin, UA: NEGATIVE
Blood, UA: NEGATIVE
Glucose, UA: NEGATIVE
Ketones, UA: NEGATIVE
Leukocytes, UA: NEGATIVE
Nitrite, UA: NEGATIVE
Protein, UA: POSITIVE — AB
Spec Grav, UA: 1.015 (ref 1.010–1.025)
Urobilinogen, UA: 0.2 E.U./dL
pH, UA: 6 (ref 5.0–8.0)

## 2018-11-01 MED ORDER — OSELTAMIVIR PHOSPHATE 75 MG PO CAPS: 75.0000 mg | ORAL_CAPSULE | Freq: Two times a day (BID) | ORAL | 0 refills | Status: AC

## 2018-11-01 NOTE — Addendum Note (Signed)
Addended by: Sharon Seller B on: 11/01/2018 02:21 PM   Modules accepted: Orders

## 2018-11-01 NOTE — Progress Notes (Signed)
Chief Complaint  Patient presents with  . Cough  . Generalized Body Aches  . Headache    Danielle Burns here for URI complaints.  Duration: 2 days  Associated symptoms: Fever (100.4 F), myalgia and cough Denies: sinus congestion, sinus pain, rhinorrhea, itchy watery eyes, ear pain, ear drainage, sore throat, wheezing and shortness of breath Treatment to date: Mucinex, ibuprofen Sick contacts: Yes- son has flu Urine is a little darker. Hx of UTI with similar s/s's.   ROS:  Const: + fevers HEENT: As noted in HPI Lungs: No SOB  Past Medical History:  Diagnosis Date  . Chronic systolic heart failure ([place]) 07/29/2015  . Gestational diabetes   . Palpitations   . Peripartum cardiomyopathy   . Recurrent UTI     BP 108/62 (BP Location: Left Arm, Patient Position: Sitting, Cuff Size: Normal)   Pulse 98   Temp 99.5 F (37.5 C) (Oral)   Ht 5\' 3"  (1.6 m)   Wt 205 lb 4 oz (93.1 kg)   SpO2 97%   BMI 36.36 kg/m  General: Awake, alert, appears stated age HEENT: AT, [place], ears patent b/l and TM's neg, nares patent w/o discharge, pharynx pink and without exudates, MMM Neck: No masses or asymmetry Heart: reg rhythm, tachycardic Lungs: CTAB, no accessory muscle use Psych: Age appropriate judgment and insight, normal mood and affect  Flu-like symptoms - Plan: oseltamivir (TAMIFLU) 75 MG capsule  Urine discoloration  Orders as above. Continue to push fluids, practice good hand hygiene, cover mouth when coughing. NSAIDs, Tylenol UA, will tx if positive. Discoloration could be dehydration.  F/u prn. If starting to experience fevers, shaking, or shortness of breath, seek immediate care. Pt voiced understanding and agreement to the plan.  [place], DO 11/01/18 1:58 PM

## 2018-11-01 NOTE — Patient Instructions (Addendum)
Continue to push fluids, practice good hand hygiene, and cover your mouth if you cough.  If you start having fevers, shaking or shortness of breath, seek immediate care.  OK to take Tylenol 1000 mg (2 extra strength tabs) or 975 mg (3 regular strength tabs) every 6 hours as needed.  Ibuprofen 400-600 mg (2-3 over the counter strength tabs) every 6 hours as needed for pain.  We will be in touch regarding your urine results.   Let us know if you need anything.

## 2019-01-08 ENCOUNTER — Telehealth: Payer: Self-pay

## 2019-01-08 NOTE — Telephone Encounter (Signed)
Noted  

## 2019-01-08 NOTE — Telephone Encounter (Signed)
Copied from [place] [id number]. Topic: General - Other >> Jan 08, 2019 12:59 PM Richardo Priest, NT wrote: Reason for CRM: Patient called in stating her husband was tested for COVID and now she is being tested. Is awaiting her results. Experienced headaches, body pains, chills. Wants practice to be aware in case a future appointment is needed.

## 2019-04-22 ENCOUNTER — Encounter: Payer: Self-pay | Admitting: Family Medicine

## 2019-04-22 ENCOUNTER — Ambulatory Visit (INDEPENDENT_AMBULATORY_CARE_PROVIDER_SITE_OTHER): Payer: Commercial Managed Care - PPO | Admitting: Family Medicine

## 2019-04-22 ENCOUNTER — Other Ambulatory Visit: Payer: Self-pay

## 2019-04-22 VITALS — Temp 100.5°F

## 2019-04-22 DIAGNOSIS — R52 Pain, unspecified: Secondary | ICD-10-CM

## 2019-04-22 DIAGNOSIS — R5081 Fever presenting with conditions classified elsewhere: Secondary | ICD-10-CM

## 2019-04-22 DIAGNOSIS — R350 Frequency of micturition: Secondary | ICD-10-CM | POA: Diagnosis not present

## 2019-04-22 MED ORDER — CIPROFLOXACIN HCL 250 MG PO TABS: 250.0000 mg | ORAL_TABLET | Freq: Two times a day (BID) | ORAL | 0 refills | Status: DC

## 2019-04-22 NOTE — Progress Notes (Signed)
Virtual Visit via Video Note  I connected with Danielle Burns on 04/22/19 at  3:40 PM EDT by a video enabled telemedicine application and verified that I am speaking with the correct person using two identifiers.  Location: Patient: home  Provider: office   I discussed the limitations of evaluation and management by telemedicine and the availability of in person appointments. The patient expressed understanding and agreed to proceed.  History of Present Illness: Pt is home in bed c/o body aches , fever, sore throat, low abd discomfort and urinary frequency since yesterday.   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   No other complaints.     Past Medical History:  Diagnosis Date  . Chronic systolic heart failure ([place]) 07/29/2015  . Gestational diabetes   . Palpitations   . Peripartum cardiomyopathy   . Recurrent UTI    Current Outpatient Medications on File Prior to Visit  Medication Sig Dispense Refill  . diazepam (VALIUM) 5 MG tablet Take 1 tablet (5 mg total) by mouth 2 (two) times daily. 10 tablet 0  . folic acid (FOLVITE) 1 MG tablet Take 2 tablets (2 mg total) by mouth daily. 60 tablet 11  . methocarbamol (ROBAXIN) 500 MG tablet Take 1 tablet (500 mg total) by mouth every 6 (six) hours as needed for muscle spasms. 30 tablet 1  . Multiple Vitamin (MULTIVITAMIN) tablet Take 1 tablet by mouth 2 (two) times a week. Reported on 02/20/2016    . naproxen (NAPROSYN) 500 MG tablet Take 1 tablet (500 mg total) by mouth 2 (two) times daily with a meal. 30 tablet 0  . omeprazole (PRILOSEC) 20 MG capsule Take 1 capsule (20 mg total) by mouth daily. 30 capsule 3  . vitamin C (ASCORBIC ACID) 500 MG tablet Take 500 mg by mouth daily as needed (when feeling sick).     No current facility-administered medications on file prior to visit.     Observations/Objective: Vitals:   04/22/19 1542  Temp: (!) 100.5 F (38.1 C)   Pt is in no resp distress but obviously feeling sick  --- laying in bed  Assessment and Plan: 1. Fever in other diseases Pt to get tested for covid  - Novel Coronavirus, NAA ([facility]); Future  2. Urinary frequency ? uti--  cipro x 3 days  If covid neg---  Check UA and culture if no better - ciprofloxacin (CIPRO) 250 MG tablet; Take 1 tablet (250 mg total) by mouth 2 (two) times daily.  Dispense: 6 tablet; Refill: 0  3. Body aches Check covid test  - Novel Coronavirus, NAA ([facility]); Future   Follow Up Instructions:    I discussed the assessment and treatment plan with the patient. The patient was provided an opportunity to ask questions and all were answered. The patient agreed with the plan and demonstrated an understanding of the instructions.   The patient was advised to call back or seek an in-person evaluation if the symptoms worsen or if the condition fails to improve as anticipated.  I provided 15 minutes of non-face-to-face time during this encounter.   Ann Held, DO

## 2019-04-23 ENCOUNTER — Other Ambulatory Visit: Payer: Self-pay

## 2019-04-23 DIAGNOSIS — Z20822 Contact with and (suspected) exposure to covid-19: Secondary | ICD-10-CM

## 2019-04-25 LAB — NOVEL CORONAVIRUS, NAA: SARS-CoV-2, NAA: NOT DETECTED

## 2019-08-26 ENCOUNTER — Telehealth: Payer: Self-pay

## 2019-08-26 NOTE — Telephone Encounter (Signed)
Spoke with patient. Patient not happy about the virtual appointment. I explained to the patient that due to her symptoms she can not be seen in the office. Pt states that there are other sickness in the world other COVID and she needs labs and her urine checked. I explained to the patient that I understood her concerns but due to protocol she can not be seen in the office. Pt wanted to know where she could be seen and I advised [facility].

## 2019-08-26 NOTE — Telephone Encounter (Signed)
Copied from [place] [id number]. Topic: General - Other >> Aug 26, 2019  9:25 AM Keene Breath wrote: Reason for CRM: Patient called to schedule an appt with the doctor for stomach pain and stated that she is achy all over.  Tried the office but no answer.  Please call to schedule appt.

## 2019-09-04 ENCOUNTER — Encounter: Payer: Self-pay | Admitting: Family Medicine

## 2019-09-04 ENCOUNTER — Other Ambulatory Visit: Payer: Self-pay | Admitting: Obstetrics and Gynecology

## 2019-09-04 DIAGNOSIS — N6310 Unspecified lump in the right breast, unspecified quadrant: Secondary | ICD-10-CM

## 2019-09-04 DIAGNOSIS — N6320 Unspecified lump in the left breast, unspecified quadrant: Secondary | ICD-10-CM

## 2019-09-18 ENCOUNTER — Ambulatory Visit
Admission: RE | Admit: 2019-09-18 | Discharge: 2019-09-18 | Disposition: A | Discharge status: Home / Self Care | Payer: Commercial Managed Care - PPO | Source: Ambulatory Visit | Attending: Obstetrics and Gynecology | Admitting: Obstetrics and Gynecology

## 2019-09-18 ENCOUNTER — Other Ambulatory Visit: Payer: Self-pay

## 2019-09-18 DIAGNOSIS — N6320 Unspecified lump in the left breast, unspecified quadrant: Secondary | ICD-10-CM

## 2019-09-18 DIAGNOSIS — N6310 Unspecified lump in the right breast, unspecified quadrant: Secondary | ICD-10-CM

## 2019-09-23 ENCOUNTER — Telehealth: Payer: Self-pay | Admitting: *Deleted

## 2019-09-23 NOTE — Telephone Encounter (Signed)
Leave message for pt to call back to schedule pre op appt

## 2019-09-23 NOTE — Telephone Encounter (Signed)
   [facility] Pre-operative Risk Assessment    Request for surgical clearance:  1. What type of surgery is being performed? EGD COLONOSCOPY  2. When is this surgery scheduled? 10/02/19   3. What type of clearance is required (medical clearance vs. Pharmacy clearance to hold med vs. Both)? MEDICAL  4. Are there any medications that need to be held prior to surgery and how long? NONE   5. Practice name and name of physician performing surgery? [facility]   6. What is your office phone number [phone]    7.   What is your office fax number [phone]  8.   Anesthesia type (None, local, MAC, general) ? PROPOFOL   Devra Dopp 09/23/2019, 4:34 PM  _________________________________________________________________   (provider comments below)

## 2019-09-23 NOTE — Telephone Encounter (Signed)
Primary Cardiologist: Dr.[place]  Chart reviewed as part of pre-operative protocol coverage. Because of Favor Reiber's past medical history and time since last visit, he/she will require a follow-up visit in order to better assess preoperative cardiovascular risk. Has not been seen by cardiology since 03/2018   Pre-op covering staff: - Please schedule appointment and call patient to inform them. - Please contact requesting surgeon's office via preferred method (i.e, phone, fax) to inform them of need for appointment prior to surgery.  If applicable, this message will also be routed to pharmacy pool and/or primary cardiologist for input on holding anticoagulant/antiplatelet agent as requested below so that this information is available at time of patient's appointment.   Jory Sims, NP  09/23/2019, 4:49 PM

## 2019-09-24 NOTE — Telephone Encounter (Signed)
Appointment schedule on 01/08 with Coletta Memos

## 2019-09-26 NOTE — Progress Notes (Addendum)
Cardiology Clinic Note   Patient Name: Danielle Burns Date of Encounter: 09/27/2019  Primary Care Provider:  Carollee Herter, Alferd Apa, DO Primary Cardiologist:  Skeet Latch, MD  Patient Profile    Danielle Burns 49 year old female presents today for follow-up of her peripartum cardiomyopathy, chronic systolic heart failure, preop cardiac evaluation and palpitations.  Past Medical History    Past Medical History:  Diagnosis Date  . Chronic systolic heart failure ([place]) 07/29/2015  . Gestational diabetes   . Palpitations   . Peripartum cardiomyopathy   . Recurrent UTI    Past Surgical History:  Procedure Laterality Date  . TUBAL LIGATION      Allergies  No Known Allergies  History of Present Illness    Danielle Burns has a past medical history of peripartum cardiomyopathy, chronic systolic and diastolic heart failure, and palpitations.  Her pericardium cardiomyopathy was diagnosed in 2012.  During that time her ejection fraction was reported to be in the 30s.  She subsequently recovered to normal systolic function and was dismissed from Dr. Lovena Le service.  She was again seen in the clinic 04/2015 due to weight gain and edema.  She underwent echocardiogram 05/05/2015 which showed an LV function reduced to 40-45%.  She was started on metoprolol and lisinopril.  She developed a cough and her lisinopril was switched to losartan.  Spironolactone was added to her medication regimen.  She improved and her BNP was 15.  She was last seen by Dr. Oval Linsey 03/2018.  During that time she stated she had been seen in the ED for chest pain 6/19.  The episode occurred while she was at work.  Her chest discomfort seemed to improve when she walked.  She noted nausea but no associated shortness of breath or diaphoresis.  Her episodes occurred randomly and lasted for a few minutes.  She stated she had recurrent episodes throughout the day.  In the emergency department her EKG and cardiac enzymes were negative.  She  then followed up with her PCP and was tested for H. pylori.  Since starting treatment she did not have any recurrent symptoms.  She continued to be physically active lifting weights and walking on the treadmill at her gym without any exertional symptoms.  Mild lower extremity was noted at the end of the day after standing however, this would improve with elevation of her legs.  She denied orthopnea and PND.  She had been off heart failure medications for 1 year and indicated that the medications have not been sent to her pharmacy.  She presents to the clinic today and states she has not been going to the gym due to the COVID-19 pandemic.  She has noticed some increased dyspnea with exertion, doing activities such as climbing stairs.  She does state that when she returns to normal activities her breathing returns to normal.  She states that she is received 2 IV infusions from oncology/hematology (Dr. Antonieta Pert office) and she is receiving third infusion today.  She feels that some of her increased work of breathing is due to her low hemoglobin of 7.2.  I will request the CBC result from [place] endoscopy Dr. Lorie Apley office, where it was drawn 1 week ago.  She went on to say that she is having an evaluation of her fibroids via biopsy and embolization from her OB/GYN due to her heavy menstrual cycles.  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  I will order a  BMP today, a BNP, and repeat her echocardiogram.  She denies chest pain, lower extremity edema, fatigue, palpitations, melena, hematuria, hemoptysis, diaphoresis, weakness, presyncope, syncope, orthopnea, and PND.   Home Medications    Prior to Admission medications   Medication Sig Start Date End Date Taking? Authorizing Provider  ciprofloxacin (CIPRO) 250 MG tablet Take 1 tablet (250 mg total) by mouth 2 (two) times daily. 04/22/19   Ann Held, DO  diazepam (VALIUM) 5 MG tablet Take 1 tablet (5 mg  total) by mouth 2 (two) times daily. 07/14/18   Isla Pence, MD  folic acid (FOLVITE) 1 MG tablet Take 2 tablets (2 mg total) by mouth daily. 04/19/18   [place], Holli Humbles, NP  methocarbamol (ROBAXIN) 500 MG tablet Take 1 tablet (500 mg total) by mouth every 6 (six) hours as needed for muscle spasms. 07/26/18   Ann Held, DO  Multiple Vitamin (MULTIVITAMIN) tablet Take 1 tablet by mouth 2 (two) times a week. Reported on 02/20/2016    [provider]  naproxen (NAPROSYN) 500 MG tablet Take 1 tablet (500 mg total) by mouth 2 (two) times daily with a meal. 07/26/18   Carollee Herter, Alferd Apa, DO  omeprazole (PRILOSEC) 20 MG capsule Take 1 capsule (20 mg total) by mouth daily. 03/16/18   Ann Held, DO  vitamin C (ASCORBIC ACID) 500 MG tablet Take 500 mg by mouth daily as needed (when feeling sick).    [provider]    Family History    Family History  Problem Relation Age of Onset  . Diabetes Mother        type II  . Hypertension Mother   . Heart failure Mother   . Parkinsonism Father   . Cancer Neg Hx        negative for breast and colon   She indicated that her mother is alive. She indicated that the status of her father is unknown. She indicated that her sister is alive. She indicated that her brother is alive. She indicated that the status of her neg hx is unknown.  Social History    Social History   Socioeconomic History  . Marital status: Married    Spouse name: Not on file  . Number of children: 4  . Years of education: Not on file  . Highest education level: Not on file  Occupational History  . Occupation: Programmer, multimedia: [employer]  Tobacco Use  . Smoking status: Never Smoker  . Smokeless tobacco: Never Used  Substance and Sexual Activity  . Alcohol use: No  . Drug use: Never  . Sexual activity: Yes    Birth control/protection: None  Other Topics Concern  . Not on file  Social History Narrative   Originally from [place].   Accompanied by son - [place] 3 yrs   2 sons and 2 daughters   Social Determinants of Health   Financial Resource Strain:   . Difficulty of Paying Living Expenses: Not on file  Food Insecurity:   . Worried About Charity fundraiser in the Last Year: Not on file  . Ran Out of Food in the Last Year: Not on file  Transportation Needs:   . Lack of Transportation (Medical): Not on file  . Lack of Transportation (Non-Medical): Not on file  Physical Activity:   . Days of Exercise per Week: Not on file  . Minutes of Exercise per Session: Not on file  Stress:   . Feeling of Stress : Not on file  Social Connections:   . Frequency of Communication with Friends and Family: Not on file  . Frequency of Social Gatherings with Friends and Family: Not on file  . Attends Religious Services: Not on file  . Active Member of Clubs or Organizations: Not on file  . Attends Archivist Meetings: Not on file  . Marital Status: Not on file  Intimate Partner Violence:   . Fear of Current or Ex-Partner: Not on file  . Emotionally Abused: Not on file  . Physically Abused: Not on file  . Sexually Abused: Not on file     Review of Systems    General:  No chills, fever, night sweats or weight changes.  Cardiovascular:  No chest pain, dyspnea on exertion, edema, orthopnea, palpitations, paroxysmal nocturnal dyspnea. Dermatological: No rash, lesions/masses Respiratory: No cough, dyspnea Urologic: No hematuria, dysuria Abdominal:   No nausea, vomiting, diarrhea, bright red blood per rectum, melena, or hematemesis Neurologic:  No visual changes, wkns, changes in mental status. All other systems reviewed and are otherwise negative except as noted above.  Physical Exam    VS:  BP 114/77 (BP Location: Left Arm, Patient Position: Sitting, Cuff Size: Normal)   Pulse 87   Temp 97.9 F (36.6 C)   Ht 5\' 3"  (1.6 m)   Wt 203 lb (92.1 kg)   SpO2 99%   BMI 35.96 kg/m  , BMI Body mass index is  35.96 kg/m. GEN: Well nourished, well developed, in no acute distress. HEENT: normal. Neck: Supple, no JVD, carotid bruits, or masses. Cardiac: RRR, no murmurs, rubs, or gallops. No clubbing, cyanosis, edema.  Radials/DP/PT 2+ and equal bilaterally.  Respiratory:  Respirations regular and unlabored, clear to auscultation bilaterally. GI: Soft, nontender, nondistended, BS + x 4. MS: no deformity or atrophy. Skin: warm and dry, no rash. Neuro:  Strength and sensation are intact. Psych: Normal affect.  Accessory Clinical Findings    ECG personally reviewed by me today-normal sinus rhythm rightward axis deviation 87 bpm- No acute changes  EKG 04/10/2018 Normal sinus rhythm 63 bpm  Echocardiogram 04/23/2018 Study Conclusions  - Left ventricle: The cavity size was normal. Wall thickness was   normal. Systolic function was normal. The estimated ejection   fraction was in the range of 50% to 55%. Wall motion was normal;   there were no regional wall motion abnormalities. Doppler   parameters are consistent with high ventricular filling pressure. - Pericardium, extracardiac: A trivial pericardial effusion was   identified.   Assessment & Plan   1.  Chronic systolic heart failure/shortness of breath -appears euvolemic today, weight 203 pounds which is down from 205.25 on 11/01/2018. Echocardiogram 8/19 showed LVEF 50 to 55% with normal wall motion, higher ventricular filling pressure, and trivial pericardial effusion.  Had stopped all of her heart failure medications 2018.  Dyspnea on exertion today in the setting of deconditioning versus anemia versus heart failure. Repeat echocardiogram-addend 10/03/2019 LVEF 55 to 60%, no LVH, small pericardial effusion, no abnormalities visualized among valves. order BMP and BNP Increase physical activity as tolerated  Chest pain-no chest pain today.  PCP treated patient for H. pylori after acute episode of atypical chest discomfort.   Resolved  Palpitations-EKG today shows normal sinus rhythm rightward axis deviation 87 bpm.  She remained stable off metoprolol Continue to monitor  Preoperative cardiac evaluation-     Primary Cardiologist: Skeet Latch, MD  Chart reviewed as part  of pre-operative protocol coverage. Given past medical history and time since last visit, based on ACC/AHA guidelines, Kyliana Massar would be at acceptable risk for the planned procedure without further cardiovascular testing.   Her RCRI  risk is 0.9%, a class II risk of major cardiac event.  She is easily able to complete more than 4 METS of physical activity.  I will route this recommendation to the requesting party via Epic fax function and remove from pre-op pool.  Please call with questions.    Discuss heart healthy low-sodium diet, and maintaining her exercise regimen.  Disposition: Follow-up with me after echocardiogram   Jossie Ng. [facility] [place] [address] Office [phone] Fax [phone]

## 2019-09-27 ENCOUNTER — Encounter: Payer: Self-pay | Admitting: General Practice

## 2019-09-27 ENCOUNTER — Inpatient Hospital Stay (HOSPITAL_BASED_OUTPATIENT_CLINIC_OR_DEPARTMENT_OTHER): Payer: Commercial Managed Care - PPO | Admitting: Family

## 2019-09-27 ENCOUNTER — Inpatient Hospital Stay: Payer: Commercial Managed Care - PPO

## 2019-09-27 ENCOUNTER — Encounter: Payer: Self-pay | Admitting: Family

## 2019-09-27 ENCOUNTER — Other Ambulatory Visit: Payer: Self-pay | Admitting: *Deleted

## 2019-09-27 ENCOUNTER — Ambulatory Visit (INDEPENDENT_AMBULATORY_CARE_PROVIDER_SITE_OTHER): Payer: Commercial Managed Care - PPO | Admitting: General Practice

## 2019-09-27 ENCOUNTER — Inpatient Hospital Stay: Payer: Commercial Managed Care - PPO | Attending: Family

## 2019-09-27 ENCOUNTER — Other Ambulatory Visit: Payer: Self-pay

## 2019-09-27 ENCOUNTER — Other Ambulatory Visit: Payer: Self-pay | Admitting: Family

## 2019-09-27 VITALS — BP 114/77 | HR 87 | Temp 97.9°F | Ht 63.0 in | Wt 203.0 lb

## 2019-09-27 VITALS — BP 121/69 | HR 86 | Temp 97.1°F | Resp 18 | Ht 63.0 in | Wt 203.8 lb

## 2019-09-27 DIAGNOSIS — N920 Excessive and frequent menstruation with regular cycle: Secondary | ICD-10-CM | POA: Diagnosis not present

## 2019-09-27 DIAGNOSIS — R0600 Dyspnea, unspecified: Secondary | ICD-10-CM

## 2019-09-27 DIAGNOSIS — D509 Iron deficiency anemia, unspecified: Secondary | ICD-10-CM | POA: Insufficient documentation

## 2019-09-27 DIAGNOSIS — D573 Sickle-cell trait: Secondary | ICD-10-CM

## 2019-09-27 DIAGNOSIS — D500 Iron deficiency anemia secondary to blood loss (chronic): Secondary | ICD-10-CM

## 2019-09-27 DIAGNOSIS — I5042 Chronic combined systolic (congestive) and diastolic (congestive) heart failure: Secondary | ICD-10-CM | POA: Diagnosis not present

## 2019-09-27 DIAGNOSIS — R079 Chest pain, unspecified: Secondary | ICD-10-CM

## 2019-09-27 DIAGNOSIS — R002 Palpitations: Secondary | ICD-10-CM

## 2019-09-27 DIAGNOSIS — D560 Alpha thalassemia: Secondary | ICD-10-CM

## 2019-09-27 DIAGNOSIS — Z79899 Other long term (current) drug therapy: Secondary | ICD-10-CM | POA: Diagnosis not present

## 2019-09-27 DIAGNOSIS — R0609 Other forms of dyspnea: Secondary | ICD-10-CM

## 2019-09-27 LAB — RETICULOCYTES
Immature Retic Fract: 30.2 % — ABNORMAL HIGH (ref 2.3–15.9)
RBC.: 3.9 MIL/uL (ref 3.87–5.11)
Retic Count, Absolute: 55 10*3/uL (ref 19.0–186.0)
Retic Ct Pct: 1.4 % (ref 0.4–3.1)

## 2019-09-27 LAB — CBC WITH DIFFERENTIAL (CANCER CENTER ONLY)
Abs Immature Granulocytes: 0.06 10*3/uL (ref 0.00–0.07)
Basophils Absolute: 0 10*3/uL (ref 0.0–0.1)
Basophils Relative: 1 %
Eosinophils Absolute: 0.3 10*3/uL (ref 0.0–0.5)
Eosinophils Relative: 4 %
HCT: 24.2 % — ABNORMAL LOW (ref 36.0–46.0)
Hemoglobin: 6.9 g/dL — CL (ref 12.0–15.0)
Immature Granulocytes: 1 %
Lymphocytes Relative: 31 %
Lymphs Abs: 2.7 10*3/uL (ref 0.7–4.0)
MCH: 17.6 pg — ABNORMAL LOW (ref 26.0–34.0)
MCHC: 28.5 g/dL — ABNORMAL LOW (ref 30.0–36.0)
MCV: 61.9 fL — ABNORMAL LOW (ref 80.0–100.0)
Monocytes Absolute: 0.7 10*3/uL (ref 0.1–1.0)
Monocytes Relative: 8 %
Neutro Abs: 4.8 10*3/uL (ref 1.7–7.7)
Neutrophils Relative %: 55 %
Platelet Count: 402 10*3/uL — ABNORMAL HIGH (ref 150–400)
RBC: 3.91 MIL/uL (ref 3.87–5.11)
RDW: 21.1 % — ABNORMAL HIGH (ref 11.5–15.5)
WBC Count: 8.6 10*3/uL (ref 4.0–10.5)
nRBC: 0 % (ref 0.0–0.2)

## 2019-09-27 MED ORDER — SODIUM CHLORIDE 0.9 % IV SOLN
200.0000 mg | Freq: Once | INTRAVENOUS | Status: AC
  Administered 2019-09-27: 200 mg via INTRAVENOUS
  Filled 2019-09-27: qty 10

## 2019-09-27 MED ORDER — FOLIC ACID 1 MG PO TABS: 2.0000 mg | ORAL_TABLET | Freq: Every day | ORAL | 11 refills | Status: DC

## 2019-09-27 MED ORDER — SODIUM CHLORIDE 0.9 % IV SOLN
Freq: Once | INTRAVENOUS | Status: AC
  Filled 2019-09-27: qty 250

## 2019-09-27 NOTE — Progress Notes (Signed)
Hematology and Oncology Follow Up Visit  Danielle Burns [id number] [birth year] 48 y.o. 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 She is symptomatic with fatigue, weakness, dizziness, SOB with exertion and numbness and tingling in her hands and feet.  Hgb is down to 6.9, MCV 61.9.  She states that her cycles are still very heavy and that her gynecologist is going to scheduled her for uterine ablation. She is waiting to hear from their office.  She has not noted any other blood loss. No bruising or petechiae.  No fever, chills, n/v, cough, rash, chest pain, palpitations, abdominal pain or changes in bowel or bladder habits.  No swelling or tenderness in her extremities.  No falls or syncopal episodes to report.  She has maintained a good appetite and is staying well hydrated. Her weight is stable.   ECOG Performance Status: 1 - Symptomatic but completely ambulatory  Medications:  Allergies as of 09/27/2019   No Known Allergies     Medication List       Accurate as of September 27, 2019  2:36 PM. If you have any questions, ask your nurse or doctor.        STOP taking these medications   ciprofloxacin 250 MG tablet Commonly known as: Cipro Stopped by: Deberah Pelton, NP   diazepam 5 MG tablet Commonly known as: VALIUM Stopped by: Deberah Pelton, NP   methocarbamol 500 MG tablet Commonly known as: Robaxin Stopped by: Deberah Pelton, NP   naproxen 500 MG tablet Commonly known as: Naprosyn Stopped by: Deberah Pelton, NP   omeprazole 20 MG capsule Commonly known as: PRILOSEC Stopped by: Deberah Pelton, NP     TAKE these medications   folic acid 1 MG tablet Commonly known as: FOLVITE Take 2 tablets (2 mg total) by mouth daily.   multivitamin tablet Take 1 tablet by mouth 2 (two) times a week. Reported on  02/20/2016   vitamin C 500 MG tablet Commonly known as: ASCORBIC ACID Take 500 mg by mouth daily as needed (when feeling sick).       Allergies: No Known Allergies  Past Medical History, Surgical history, Social history, and Family History were reviewed and updated.  Review of Systems: All other 10 point review of systems is negative.   Physical Exam:  vitals were not taken for this visit.   Wt Readings from Last 3 Encounters:  09/27/19 203 lb (92.1 kg)  11/01/18 205 lb 4 oz (93.1 kg)  08/16/18 211 lb 13.8 oz (96.1 kg)    Ocular: Sclerae unicteric, pupils equal, round and reactive to light Ear-nose-throat: Oropharynx clear, dentition fair Lymphatic: No cervical or supraclavicular adenopathy Lungs no rales or rhonchi, good excursion bilaterally Heart regular rate and rhythm, no murmur appreciated Abd soft, nontender, positive bowel sounds, no liver or spleen tip palpated on exam, no fluid wave  MSK no focal spinal tenderness, no joint edema Neuro: non-focal, well-oriented, appropriate affect Breasts: Deferred   Lab Results  Component Value Date   WBC 8.6 09/27/2019   HGB 6.9 (LL) 09/27/2019   HCT 24.2 (L) 09/27/2019   MCV 61.9 (L) 09/27/2019   PLT 402 (H) 09/27/2019   Lab Results  Component Value Date   FERRITIN 81 05/30/2018   IRON 91 05/30/2018   TIBC 310 05/30/2018   UIBC  219 05/30/2018   IRONPCTSAT 29 05/30/2018   Lab Results  Component Value Date   RETICCTPCT 1.4 09/27/2019   RBC 3.90 09/27/2019   No results found for: KPAFRELGTCHN, LAMBDASER, KAPLAMBRATIO No results found for: IGGSERUM, IGA, IGMSERUM No results found for: Odetta Pink, SPEI   Chemistry      Component Value Date/Time   NA 140 04/19/2018 1026   NA 140 05/04/2017 1503   K 4.0 04/19/2018 1026   CL 109 04/19/2018 1026   CO2 24 04/19/2018 1026   BUN 7 04/19/2018 1026   BUN 12 05/04/2017 1503   CREATININE 0.77 04/19/2018 1026    CREATININE 0.75 09/03/2015 1542      Component Value Date/Time   CALCIUM 8.8 (L) 04/19/2018 1026   ALKPHOS 72 04/19/2018 1026   AST 18 04/19/2018 1026   ALT 14 04/19/2018 1026   BILITOT 0.3 04/19/2018 1026       Impression and Plan: Danielle Burns is a very pleasant 49 yo female with iron deficiency anemia secondary to heavy cycles as well as carrying the sickle cell trait.  We will give her Iv iron today and also schedule her for another 4 doses.  Folic acid was refilled.  We will plan to see her back in another 6 weeks.  She will contact our office with any questions or concerns. We can certainly see her sooner if needed.   Danielle Peace, NP 1/8/20212:36 PM

## 2019-09-27 NOTE — Patient Instructions (Signed)

## 2019-09-27 NOTE — Patient Instructions (Signed)
Follow-Up: IN AFTER ECHO WITH JESSE CLEAVER, FNP  In Person Coletta Memos, FNP.    LAB: BMET AND BNP TODAY-HERE IN OUR OFFICE AT [facility]-WE WILL CALL WITH THESE RESULTS  TESTING: Echocardiogram - Your physician has requested that you have an echocardiogram. Echocardiography is a painless test that uses sound waves to create images of your heart. It provides your doctor with information about the size and shape of your heart and how well your heart's chambers and valves are working. This procedure takes approximately one hour. There are no restrictions for this procedure. This will be performed at our [facility] location - [address].  Special Instructions: NOT CLEARED FOR PROCEDURE   Reduce your risk of getting COVID-19 With your heart disease it is especially important for people at increased risk of severe illness from COVID-19, and those who live with them, to protect themselves from getting COVID-19. The best way to protect yourself and to help reduce the spread of the virus that causes COVID-19 is to: Marland Kitchen Limit your interactions with other people as much as possible. . Take precautions to prevent getting COVID-19 when you do interact with others. If you start feeling sick and think you may have COVID-19, get in touch with your healthcare provider within 24  At [facility], you and your health needs are our priority.  As part of our continuing mission to provide you with exceptional heart care, we have created designated Provider Care Teams.  These Care Teams include your primary Cardiologist (physician) and Advanced Practice Providers (APPs -  Physician Assistants and Nurse Practitioners) who all work together to provide you with the care you need, when you need it.  Thank you for choosing [facility] at [facility]!!

## 2019-09-28 LAB — BASIC METABOLIC PANEL
BUN/Creatinine Ratio: 11 (ref 9–23)
BUN: 7 mg/dL (ref 6–24)
CO2: 19 mmol/L — ABNORMAL LOW (ref 20–29)
Calcium: 8.6 mg/dL — ABNORMAL LOW (ref 8.7–10.2)
Chloride: 106 mmol/L (ref 96–106)
Creatinine, Ser: 0.65 mg/dL (ref 0.57–1.00)
GFR calc Af Amer: 121 mL/min/{1.73_m2} (ref >59)
GFR calc non Af Amer: 105 mL/min/{1.73_m2} (ref >59)
Glucose: 81 mg/dL (ref 65–99)
Potassium: 4 mmol/L (ref 3.5–5.2)
Sodium: 138 mmol/L (ref 134–144)

## 2019-09-28 LAB — BRAIN NATRIURETIC PEPTIDE: BNP: 17.1 pg/mL (ref 0.0–100.0)

## 2019-09-30 ENCOUNTER — Other Ambulatory Visit: Payer: Self-pay | Admitting: Obstetrics and Gynecology

## 2019-09-30 DIAGNOSIS — D259 Leiomyoma of uterus, unspecified: Secondary | ICD-10-CM

## 2019-09-30 LAB — IRON AND TIBC
Iron: 15 ug/dL — ABNORMAL LOW (ref 41–142)
Saturation Ratios: 3 % — ABNORMAL LOW (ref 21–57)
TIBC: 478 ug/dL — ABNORMAL HIGH (ref 236–444)
UIBC: 463 ug/dL — ABNORMAL HIGH (ref 120–384)

## 2019-09-30 LAB — FERRITIN: Ferritin: 4 ng/mL — ABNORMAL LOW (ref 11–307)

## 2019-10-03 ENCOUNTER — Other Ambulatory Visit: Payer: Self-pay | Admitting: Interventional Radiology

## 2019-10-03 ENCOUNTER — Inpatient Hospital Stay: Payer: Commercial Managed Care - PPO

## 2019-10-03 ENCOUNTER — Ambulatory Visit (HOSPITAL_COMMUNITY): Payer: Commercial Managed Care - PPO | Attending: General Practice

## 2019-10-03 ENCOUNTER — Ambulatory Visit
Admission: RE | Admit: 2019-10-03 | Discharge: 2019-10-03 | Disposition: A | Discharge status: Home / Self Care | Payer: Commercial Managed Care - PPO | Source: Ambulatory Visit | Attending: Obstetrics and Gynecology | Admitting: Obstetrics and Gynecology

## 2019-10-03 ENCOUNTER — Other Ambulatory Visit: Payer: Self-pay

## 2019-10-03 VITALS — BP 121/73 | HR 70 | Temp 96.9°F | Resp 18

## 2019-10-03 DIAGNOSIS — D509 Iron deficiency anemia, unspecified: Secondary | ICD-10-CM | POA: Diagnosis not present

## 2019-10-03 DIAGNOSIS — D259 Leiomyoma of uterus, unspecified: Secondary | ICD-10-CM

## 2019-10-03 DIAGNOSIS — R0600 Dyspnea, unspecified: Secondary | ICD-10-CM

## 2019-10-03 DIAGNOSIS — D500 Iron deficiency anemia secondary to blood loss (chronic): Secondary | ICD-10-CM

## 2019-10-03 DIAGNOSIS — R0609 Other forms of dyspnea: Secondary | ICD-10-CM

## 2019-10-03 DIAGNOSIS — Z79899 Other long term (current) drug therapy: Secondary | ICD-10-CM | POA: Diagnosis present

## 2019-10-03 DIAGNOSIS — D250 Submucous leiomyoma of uterus: Secondary | ICD-10-CM

## 2019-10-03 HISTORY — PX: IR RADIOLOGIST EVAL & MGMT: IMG5224

## 2019-10-03 MED ORDER — SODIUM CHLORIDE 0.9 % IV SOLN
200.0000 mg | Freq: Once | INTRAVENOUS | Status: AC
  Administered 2019-10-03: 200 mg via INTRAVENOUS
  Filled 2019-10-03: qty 10

## 2019-10-03 MED ORDER — SODIUM CHLORIDE 0.9 % IV SOLN
Freq: Once | INTRAVENOUS | Status: AC
  Filled 2019-10-03: qty 250

## 2019-10-03 NOTE — Consult Note (Signed)
Chief Complaint: Patient was consulted remotely today (TeleHealth) for symptomatic uterine fibroids at the request of Cousins,Sheronette.    Referring Physician(s): Cousins,Sheronette  History of Present Illness: Danielle Burns is a 49 y.o. female who was diagnosed approximately 12 years ago with uterine fibroids, at the time of childbirth.  Over the past year, she has had progressive menorrhagia with anemia, severe enough to require iron transfusion.  She is G4 P4 with no plans for future pregnancy.  She is having no perimenopausal symptoms.  Last menstrual cycle was 10/01/2019, frequency occurring monthly, usually lasting about 7 days with 3 days of heavy bleeding requiring changing both pads and tampons every 30-60 minutes during the heavy days, with no interperiod Bleeding.  She only endorses urinary frequency as a fibroid bulk symptoms.  No previous fibroid surgeries.  No history of gynecologic infections.  Endometrial biopsy 09/19/2019 was negative.  Pap smear 09/04/2019 was negative.  Ultrasound 09/04/2019 showed uterine fibroids measuring up to 7 cm.  Past Medical History:  Diagnosis Date  . Chronic systolic heart failure ([place]) 07/29/2015  . Gestational diabetes   . Palpitations   . Peripartum cardiomyopathy   . Recurrent UTI     Past Surgical History:  Procedure Laterality Date  . TUBAL LIGATION      Allergies: Patient has no known allergies.  Medications: Prior to Admission medications   Medication Sig Start Date End Date Taking? Authorizing Provider  folic acid (FOLVITE) 1 MG tablet Take 2 tablets (2 mg total) by mouth daily. 09/27/19   [place], Holli Humbles, NP  Multiple Vitamin (MULTIVITAMIN) tablet Take 1 tablet by mouth 2 (two) times a week. Reported on 02/20/2016    [provider]  vitamin C (ASCORBIC ACID) 500 MG tablet Take 500 mg by mouth daily as needed (when feeling sick).    [provider]     Family History  Problem Relation Age of Onset  .  Diabetes Mother        type II  . Hypertension Mother   . Heart failure Mother   . Parkinsonism Father   . Cancer Neg Hx        negative for breast and colon    Social History   Socioeconomic History  . Marital status: Married    Spouse name: Not on file  . Number of children: 4  . Years of education: Not on file  . Highest education level: Not on file  Occupational History  . Occupation: Programmer, multimedia: [employer]  Tobacco Use  . Smoking status: Never Smoker  . Smokeless tobacco: Never Used  Substance and Sexual Activity  . Alcohol use: No  . Drug use: Never  . Sexual activity: Yes    Birth control/protection: None  Other Topics Concern  . Not on file  Social History Narrative   Originally from [place].   Accompanied by son - Danielle Burns 3 yrs   2 sons and 2 daughters   Social Determinants of Health   Financial Resource Strain:   . Difficulty of Paying Living Expenses: Not on file  Food Insecurity:   . Worried About Charity fundraiser in the Last Year: Not on file  . Ran Out of Food in the Last Year: Not on file  Transportation Needs:   . Lack of Transportation (Medical): Not on file  . Lack of Transportation (Non-Medical): Not on file  Physical Activity:   . Days of Exercise per Week: Not on file  .  Minutes of Exercise per Session: Not on file  Stress:   . Feeling of Stress : Not on file  Social Connections:   . Frequency of Communication with Friends and Family: Not on file  . Frequency of Social Gatherings with Friends and Family: Not on file  . Attends Religious Services: Not on file  . Active Member of Clubs or Organizations: Not on file  . Attends Archivist Meetings: Not on file  . Marital Status: Not on file   She works as a Marine scientist at [employer] and preferred would prefer to have the procedure performed at the [facility].  ECOG Status: 1 - Symptomatic but completely ambulatory  Review of Systems  Review of  Systems: A 12 point ROS discussed and pertinent positives are indicated in the HPI above.  All other systems are negative.  Physical Exam No direct physical exam was performed (except for noted visual exam findings with Video Visits).     Vital Signs: There were no vitals taken for this visit.  Imaging: US BREAST LTD UNI LEFT INC AXILLA  Result Date: 09/18/2019 CLINICAL DATA:  Screening recall for possible masses in each breast. EXAM: DIGITAL DIAGNOSTIC BILATERAL MAMMOGRAM WITH CAD AND TOMO ULTRASOUND BILATERAL BREAST COMPARISON:  Previous exam(s). ACR Breast Density Category b: There are scattered areas of fibroglandular density. FINDINGS: The possible masses noted in each breast persist on spot compression imaging. The right, this is a circumscribed, oval approximately 10 mm mass in the medial breast. The left, this is lobulated, circumscribed, 8-9 mm mass the anterior, medial breast. Mammographic images were processed with CAD. Targeted right breast ultrasound is performed, showing an oval simple cyst in the right breast at 1 o'clock, 3 cm the nipple, measuring 9 x 7 x 8 mm, consistent in size, shape and location to the mammographic mass. Targeted left breast ultrasound is performed, showing a lobulated cyst at 10 o'clock, 3 cm the nipple, anterior depth, measuring 9 x 4 x 7 mm, consistent in size, shape and location to the mammographic mass. IMPRESSION: 1. No evidence of breast malignancy. 2. Benign bilateral breast cysts. RECOMMENDATION: Screening mammogram in one year.(Code:SM-B-01Y) I have discussed the findings and recommendations with the patient. If applicable, a reminder letter will be sent to the patient regarding the next appointment. BI-RADS CATEGORY  2: Benign. Electronically Signed   By: Lajean Manes M.D.   On: 09/18/2019 09:30   US BREAST LTD UNI RIGHT INC AXILLA  Result Date: 09/18/2019 CLINICAL DATA:  Screening recall for possible masses in each breast. EXAM: DIGITAL DIAGNOSTIC  BILATERAL MAMMOGRAM WITH CAD AND TOMO ULTRASOUND BILATERAL BREAST COMPARISON:  Previous exam(s). ACR Breast Density Category b: There are scattered areas of fibroglandular density. FINDINGS: The possible masses noted in each breast persist on spot compression imaging. The right, this is a circumscribed, oval approximately 10 mm mass in the medial breast. The left, this is lobulated, circumscribed, 8-9 mm mass the anterior, medial breast. Mammographic images were processed with CAD. Targeted right breast ultrasound is performed, showing an oval simple cyst in the right breast at 1 o'clock, 3 cm the nipple, measuring 9 x 7 x 8 mm, consistent in size, shape and location to the mammographic mass. Targeted left breast ultrasound is performed, showing a lobulated cyst at 10 o'clock, 3 cm the nipple, anterior depth, measuring 9 x 4 x 7 mm, consistent in size, shape and location to the mammographic mass. IMPRESSION: 1. No evidence of breast malignancy. 2. Benign bilateral  breast cysts. RECOMMENDATION: Screening mammogram in one year.(Code:SM-B-01Y) I have discussed the findings and recommendations with the patient. If applicable, a reminder letter will be sent to the patient regarding the next appointment. BI-RADS CATEGORY  2: Benign. Electronically Signed   By: Lajean Manes M.D.   On: 09/18/2019 09:30   MM DIAG BREAST TOMO BILATERAL  Result Date: 09/18/2019 CLINICAL DATA:  Screening recall for possible masses in each breast. EXAM: DIGITAL DIAGNOSTIC BILATERAL MAMMOGRAM WITH CAD AND TOMO ULTRASOUND BILATERAL BREAST COMPARISON:  Previous exam(s). ACR Breast Density Category b: There are scattered areas of fibroglandular density. FINDINGS: The possible masses noted in each breast persist on spot compression imaging. The right, this is a circumscribed, oval approximately 10 mm mass in the medial breast. The left, this is lobulated, circumscribed, 8-9 mm mass the anterior, medial breast. Mammographic images were processed  with CAD. Targeted right breast ultrasound is performed, showing an oval simple cyst in the right breast at 1 o'clock, 3 cm the nipple, measuring 9 x 7 x 8 mm, consistent in size, shape and location to the mammographic mass. Targeted left breast ultrasound is performed, showing a lobulated cyst at 10 o'clock, 3 cm the nipple, anterior depth, measuring 9 x 4 x 7 mm, consistent in size, shape and location to the mammographic mass. IMPRESSION: 1. No evidence of breast malignancy. 2. Benign bilateral breast cysts. RECOMMENDATION: Screening mammogram in one year.(Code:SM-B-01Y) I have discussed the findings and recommendations with the patient. If applicable, a reminder letter will be sent to the patient regarding the next appointment. BI-RADS CATEGORY  2: Benign. Electronically Signed   By: Lajean Manes M.D.   On: 09/18/2019 09:30   ECHOCARDIOGRAM COMPLETE  Result Date: 10/03/2019   ECHOCARDIOGRAM REPORT   Patient Name:   Danielle Burns   Date of Exam: 10/03/2019 Medical Rec #:  [id number]     Height:       63.0 in Accession #:    [id number]    Weight:       203.8 lb Date of Birth:  [birth year]      BSA:          1.95 m Patient Age:    45 years      BP:           121/69 mmHg Patient Gender: F             HR:           69 bpm. Exam Location:  [facility] Procedure: 2D Echo, 3D Echo, Cardiac Doppler, Color Doppler and Strain Analysis Indications:    R06.00 Dyspnea on exertion  History:        Patient has prior history of Echocardiogram examinations, most                 recent 04/23/2018. CHF and Postpartum cardiomyoapthy,                 Arrythmias:Palpitation; Risk Factors:Diabetes. Anemia.  Sonographer:    Jessee Avers, RDCS Referring Phys: [id number] [place]  1. Left ventricular ejection fraction, by visual estimation, is 55 to 60%. The left ventricle has normal function. There is no left ventricular hypertrophy.  2. The left ventricle has no regional wall motion abnormalities.  3. Global right  ventricle has normal systolic function.The right ventricular size is normal. No increase in right ventricular wall thickness.  4. Left atrial size was normal.  5. Right atrial size was normal.  6. Small pericardial  effusion.  7. The pericardial effusion is circumferential.  8. The mitral valve is normal in structure. Trivial mitral valve regurgitation.  9. The tricuspid valve is normal in structure. 10. The aortic valve is tricuspid. Aortic valve regurgitation is not visualized. No evidence of aortic valve sclerosis or stenosis. 11. The pulmonic valve was grossly normal. Pulmonic valve regurgitation is trivial. 12. Normal pulmonary artery systolic pressure. 13. The inferior vena cava is normal in size with greater than 50% respiratory variability, suggesting right atrial pressure of 3 mmHg. In comparison to the previous echocardiogram(s): 04/23/18 EF 50-55%. FINDINGS  Left Ventricle: Left ventricular ejection fraction, by visual estimation, is 55 to 60%. The left ventricle has normal function. The left ventricle has no regional wall motion abnormalities. There is no left ventricular hypertrophy. Left ventricular diastolic parameters were normal. Right Ventricle: The right ventricular size is normal. No increase in right ventricular wall thickness. Global RV systolic function is has normal systolic function. The tricuspid regurgitant velocity is 2.30 m/s, and with an assumed right atrial pressure  of 3 mmHg, the estimated right ventricular systolic pressure is normal at 24.2 mmHg. Left Atrium: Left atrial size was normal in size. Right Atrium: Right atrial size was normal in size Pericardium: A small pericardial effusion is present. The pericardial effusion is circumferential. Mitral Valve: The mitral valve is normal in structure. There is mild thickening of the mitral valve leaflet(s). Trivial mitral valve regurgitation. Tricuspid Valve: The tricuspid valve is normal in structure. Tricuspid valve regurgitation is  trivial. Aortic Valve: The aortic valve is tricuspid. Aortic valve regurgitation is not visualized. The aortic valve is structurally normal, with no evidence of sclerosis or stenosis. Pulmonic Valve: The pulmonic valve was grossly normal. Pulmonic valve regurgitation is trivial. Pulmonic regurgitation is trivial. Aorta: The aortic root, ascending aorta and aortic arch are all structurally normal, with no evidence of dilitation or obstruction. Venous: The inferior vena cava is normal in size with greater than 50% respiratory variability, suggesting right atrial pressure of 3 mmHg. IAS/Shunts: No atrial level shunt detected by color flow Doppler.  LEFT VENTRICLE PLAX 2D LVIDd:         5.10 cm  Diastology LVIDs:         3.50 cm  LV e' lateral:   9.14 cm/s LV PW:         0.90 cm  LV E/e' lateral: 9.8 LV IVS:        0.90 cm  LV e' medial:    6.53 cm/s LVOT diam:     2.00 cm  LV E/e' medial:  13.7 LV SV:         73 ml LV SV Index:   35.24    2D Longitudinal Strain LVOT Area:     3.14 cm 2D Strain GLS (A2C):   -21.1 %                         2D Strain GLS (A3C):   -22.0 %                         2D Strain GLS (A4C):   -18.5 %                         2D Strain GLS Avg:     -20.5 %  3D Volume EF:                         3D EF:        55 %                         LV EDV:       147 ml                         LV ESV:       65 ml                         LV SV:        82 ml RIGHT VENTRICLE RV Basal diam:  4.00 cm RV Mid diam:    2.80 cm RV S prime:     13.50 cm/s TAPSE (M-mode): 2.8 cm RVSP:           24.2 mmHg LEFT ATRIUM             Index       RIGHT ATRIUM           Index LA diam:        4.30 cm 2.21 cm/m  RA Pressure: 3.00 mmHg LA Vol (A2C):   38.4 ml 19.70 ml/m RA Area:     13.80 cm LA Vol (A4C):   33.7 ml 17.29 ml/m RA Volume:   34.20 ml  17.55 ml/m LA Biplane Vol: 35.7 ml 18.32 ml/m  AORTIC VALVE LVOT Vmax:   96.60 cm/s LVOT Vmean:  71.200 cm/s LVOT VTI:    0.225 m  AORTA Ao Root diam: 2.50  cm Ao Asc diam:  2.90 cm MITRAL VALVE                        TRICUSPID VALVE                                     TR Peak grad:   21.2 mmHg                                     TR Vmax:        230.00 cm/s MV Decel Time: 225 msec             Estimated RAP:  3.00 mmHg MV E velocity: 89.50 cm/s 103 cm/s  RVSP:           24.2 mmHg MV A velocity: 62.10 cm/s 70.3 cm/s MV E/A ratio:  1.44       1.5       SHUNTS                                     Systemic VTI:  0.22 m                                     Systemic Diam: 2.00 cm  Buford Dresser MD Electronically signed by Buford Dresser MD Signature Date/Time: 10/03/2019/11:35:29 AM    Final  Labs:  CBC: Recent Labs    09/27/19 1416  WBC 8.6  HGB 6.9*  HCT 24.2*  PLT 402*    COAGS: No results for input(s): INR, APTT in the last 8760 hours.  BMP: Recent Labs    09/27/19 1122  NA 138  K 4.0  CL 106  CO2 19*  GLUCOSE 81  BUN 7  CALCIUM 8.6*  CREATININE 0.65  GFRNONAA 105  GFRAA 121    LIVER FUNCTION TESTS: No results for input(s): BILITOT, AST, ALT, ALKPHOS, PROT, ALBUMIN in the last 8760 hours.  TUMOR MARKERS: No results for input(s): AFPTM, CEA, CA199, CHROMGRNA in the last 8760 hours.  Assessment and Plan:  My impression is that this patient's menorrhagia is  likely secondary to the uterine fibroids. We spent the majority of the consultation discussing the pathophysiology of uterine leiomyomata, natural history, anticipated  involution post menopause, and treatment options. We discussed myomectomy, hysterectomy, and uterine fibroid embolization. I described the technique of UFE, anticipated benefits, possible risks and complications including but not limited to bleeding, infection, vessel damage, nontarget embolization, and incomplete symptom relief. We discussed the 90% clinical success rate historically and at our experience with UFE for menorrhagia. We discussed the post procedure course and time course of symptom  resolution. We discussed the need for continued gynecologic care.  She seemed to understand and did ask appropriate questions, which were answered.  Based on her evaluation thus far, I think she would be an appropriate candidate for uterine fibroid embolization because of her symptomatology and  uterine fibroids. To complete her evaluation and workup, I would require pelvic MRI with contrast to best determine the exact site of location of her uterine fibroids, specifically to exclude any pedunculated fibroids on a narrow stalk, as well as to exclude any unexpected pelvic pathology.   After our discussion, the patient was motivated proceed. Accordingly, we can set up the MRI   at her convenience as an outpatient. If this looks okay, she would like to proceed with UFE.  We talked about the current moratorium on overnight elective admissions/observation due to the pandemic, which may delay scheduling unless she opted for same-day surgery with hypogastric block; she can consider these options while we finish her evaluation with the MRI.   Thank you for this interesting consult.  I greatly enjoyed meeting Emya Madry and look forward to participating in their care.  A copy of this report was sent to the requesting provider on this date.  Electronically Signed: Rickard Rhymes 10/03/2019, 3:20 PM   I spent a total of  30 Minutes   in remote  clinical consultation, greater than 50% of which was counseling/coordinating care for symptomatic uterine fibroids.  Visit type: Audio and video (WebEx).   Alternative for in-person consultation at [facility], [address] [place], [place], [place]. This visit type was conducted due to national recommendations for restrictions regarding the COVID-19 Pandemic (e.g. social distancing).  This format is felt to be most appropriate for this patient at this time.  All issues noted in this document were discussed and addressed.

## 2019-10-03 NOTE — Patient Instructions (Signed)

## 2019-10-07 ENCOUNTER — Other Ambulatory Visit: Payer: Self-pay

## 2019-10-07 ENCOUNTER — Inpatient Hospital Stay: Payer: Commercial Managed Care - PPO

## 2019-10-07 VITALS — BP 130/74 | HR 71 | Temp 97.0°F | Resp 16

## 2019-10-07 DIAGNOSIS — D500 Iron deficiency anemia secondary to blood loss (chronic): Secondary | ICD-10-CM

## 2019-10-07 DIAGNOSIS — D509 Iron deficiency anemia, unspecified: Secondary | ICD-10-CM | POA: Diagnosis not present

## 2019-10-07 MED ORDER — SODIUM CHLORIDE 0.9 % IV SOLN
200.0000 mg | Freq: Once | INTRAVENOUS | Status: AC
  Administered 2019-10-07: 11:00:00 200 mg via INTRAVENOUS
  Filled 2019-10-07: qty 10

## 2019-10-07 MED ORDER — SODIUM CHLORIDE 0.9 % IV SOLN
Freq: Once | INTRAVENOUS | Status: AC
  Filled 2019-10-07: qty 250

## 2019-10-07 NOTE — Patient Instructions (Signed)

## 2019-10-17 ENCOUNTER — Other Ambulatory Visit: Payer: Self-pay

## 2019-10-17 ENCOUNTER — Inpatient Hospital Stay: Payer: Commercial Managed Care - PPO

## 2019-10-17 ENCOUNTER — Ambulatory Visit (HOSPITAL_COMMUNITY)
Admission: RE | Admit: 2019-10-17 | Discharge: 2019-10-17 | Disposition: A | Discharge status: Home / Self Care | Payer: Commercial Managed Care - PPO | Source: Ambulatory Visit | Attending: Interventional Radiology | Admitting: Interventional Radiology

## 2019-10-17 VITALS — BP 123/77 | HR 65 | Temp 98.5°F | Resp 18

## 2019-10-17 DIAGNOSIS — D250 Submucous leiomyoma of uterus: Secondary | ICD-10-CM | POA: Diagnosis not present

## 2019-10-17 DIAGNOSIS — D500 Iron deficiency anemia secondary to blood loss (chronic): Secondary | ICD-10-CM

## 2019-10-17 DIAGNOSIS — D509 Iron deficiency anemia, unspecified: Secondary | ICD-10-CM | POA: Diagnosis not present

## 2019-10-17 MED ORDER — SODIUM CHLORIDE 0.9 % IV SOLN
200.0000 mg | Freq: Once | INTRAVENOUS | Status: AC
  Administered 2019-10-17: 200 mg via INTRAVENOUS
  Filled 2019-10-17: qty 200

## 2019-10-17 MED ORDER — SODIUM CHLORIDE 0.9 % IV SOLN
Freq: Once | INTRAVENOUS | Status: AC
  Filled 2019-10-17: qty 250

## 2019-10-17 MED ORDER — GADOBUTROL 1 MMOL/ML IV SOLN
10.0000 mL | Freq: Once | INTRAVENOUS | Status: AC | PRN
  Administered 2019-10-17: 10 mL via INTRAVENOUS

## 2019-10-17 NOTE — Patient Instructions (Signed)

## 2019-10-21 ENCOUNTER — Other Ambulatory Visit: Payer: Self-pay

## 2019-10-21 ENCOUNTER — Inpatient Hospital Stay: Payer: Commercial Managed Care - PPO | Attending: Family

## 2019-10-21 VITALS — BP 129/78 | HR 70 | Temp 97.7°F | Resp 17

## 2019-10-21 DIAGNOSIS — N920 Excessive and frequent menstruation with regular cycle: Secondary | ICD-10-CM | POA: Diagnosis not present

## 2019-10-21 DIAGNOSIS — D509 Iron deficiency anemia, unspecified: Secondary | ICD-10-CM | POA: Diagnosis not present

## 2019-10-21 DIAGNOSIS — D500 Iron deficiency anemia secondary to blood loss (chronic): Secondary | ICD-10-CM

## 2019-10-21 MED ORDER — SODIUM CHLORIDE 0.9 % IV SOLN
200.0000 mg | Freq: Once | INTRAVENOUS | Status: AC
  Administered 2019-10-21: 10:00:00 200 mg via INTRAVENOUS
  Filled 2019-10-21: qty 200

## 2019-10-21 MED ORDER — SODIUM CHLORIDE 0.9 % IV SOLN
Freq: Once | INTRAVENOUS | Status: AC
  Filled 2019-10-21: qty 250

## 2019-11-08 ENCOUNTER — Inpatient Hospital Stay: Payer: Commercial Managed Care - PPO

## 2019-11-08 ENCOUNTER — Inpatient Hospital Stay: Payer: Commercial Managed Care - PPO | Admitting: Family

## 2019-11-15 ENCOUNTER — Inpatient Hospital Stay: Payer: Commercial Managed Care - PPO

## 2019-11-15 ENCOUNTER — Inpatient Hospital Stay: Payer: Commercial Managed Care - PPO | Admitting: Family

## 2019-12-02 LAB — HM COLONOSCOPY

## 2019-12-03 ENCOUNTER — Other Ambulatory Visit (HOSPITAL_COMMUNITY): Payer: Self-pay | Admitting: Interventional Radiology

## 2019-12-03 DIAGNOSIS — D259 Leiomyoma of uterus, unspecified: Secondary | ICD-10-CM

## 2020-01-06 ENCOUNTER — Other Ambulatory Visit (HOSPITAL_COMMUNITY)
Admission: RE | Admit: 2020-01-06 | Discharge: 2020-01-06 | Disposition: A | Discharge status: Home / Self Care | Payer: Commercial Managed Care - PPO | Source: Ambulatory Visit | Attending: Interventional Radiology | Admitting: Interventional Radiology

## 2020-01-06 DIAGNOSIS — Z20822 Contact with and (suspected) exposure to covid-19: Secondary | ICD-10-CM | POA: Diagnosis not present

## 2020-01-06 DIAGNOSIS — Z01812 Encounter for preprocedural laboratory examination: Secondary | ICD-10-CM | POA: Diagnosis present

## 2020-01-06 LAB — SARS CORONAVIRUS 2 (TAT 6-24 HRS): SARS Coronavirus 2: NEGATIVE

## 2020-01-07 ENCOUNTER — Other Ambulatory Visit: Payer: Self-pay | Admitting: Radiology

## 2020-01-09 ENCOUNTER — Ambulatory Visit (HOSPITAL_COMMUNITY)
Admission: RE | Admit: 2020-01-09 | Discharge: 2020-01-10 | Disposition: A | Discharge status: Home / Self Care | Payer: Commercial Managed Care - PPO | Source: Ambulatory Visit | Attending: Interventional Radiology | Admitting: Interventional Radiology

## 2020-01-09 ENCOUNTER — Ambulatory Visit (HOSPITAL_COMMUNITY)
Admission: RE | Admit: 2020-01-09 | Discharge: 2020-01-09 | Disposition: A | Discharge status: Home / Self Care | Payer: Commercial Managed Care - PPO | Source: Ambulatory Visit | Attending: Interventional Radiology | Admitting: Interventional Radiology

## 2020-01-09 ENCOUNTER — Other Ambulatory Visit: Payer: Self-pay

## 2020-01-09 DIAGNOSIS — D259 Leiomyoma of uterus, unspecified: Secondary | ICD-10-CM | POA: Diagnosis present

## 2020-01-09 DIAGNOSIS — D649 Anemia, unspecified: Secondary | ICD-10-CM | POA: Diagnosis not present

## 2020-01-09 HISTORY — PX: IR ANGIOGRAM PELVIS SELECTIVE OR SUPRASELECTIVE: IMG661

## 2020-01-09 HISTORY — PX: IR ANGIOGRAM SELECTIVE EACH ADDITIONAL VESSEL: IMG667

## 2020-01-09 HISTORY — PX: IR US GUIDE VASC ACCESS LEFT: IMG2389

## 2020-01-09 HISTORY — PX: IR EMBO TUMOR ORGAN ISCHEMIA INFARCT INC GUIDE ROADMAPPING: IMG5449

## 2020-01-09 LAB — CBC WITH DIFFERENTIAL/PLATELET
Abs Immature Granulocytes: 0.01 10*3/uL (ref 0.00–0.07)
Basophils Absolute: 0 10*3/uL (ref 0.0–0.1)
Basophils Relative: 1 %
Eosinophils Absolute: 0.2 10*3/uL (ref 0.0–0.5)
Eosinophils Relative: 3 %
HCT: 32.3 % — ABNORMAL LOW (ref 36.0–46.0)
Hemoglobin: 10.5 g/dL — ABNORMAL LOW (ref 12.0–15.0)
Immature Granulocytes: 0 %
Lymphocytes Relative: 34 %
Lymphs Abs: 2.1 10*3/uL (ref 0.7–4.0)
MCH: 27.3 pg (ref 26.0–34.0)
MCHC: 32.5 g/dL (ref 30.0–36.0)
MCV: 83.9 fL (ref 80.0–100.0)
Monocytes Absolute: 0.6 10*3/uL (ref 0.1–1.0)
Monocytes Relative: 10 %
Neutro Abs: 3.2 10*3/uL (ref 1.7–7.7)
Neutrophils Relative %: 52 %
Platelets: 348 10*3/uL (ref 150–400)
RBC: 3.85 MIL/uL — ABNORMAL LOW (ref 3.87–5.11)
RDW: 16.8 % — ABNORMAL HIGH (ref 11.5–15.5)
WBC: 6.1 10*3/uL (ref 4.0–10.5)
nRBC: 0 % (ref 0.0–0.2)

## 2020-01-09 LAB — BASIC METABOLIC PANEL
Anion gap: 6 (ref 5–15)
BUN: 9 mg/dL (ref 6–20)
CO2: 23 mmol/L (ref 22–32)
Calcium: 8.7 mg/dL — ABNORMAL LOW (ref 8.9–10.3)
Chloride: 109 mmol/L (ref 98–111)
Creatinine, Ser: 0.75 mg/dL (ref 0.44–1.00)
GFR calc Af Amer: >60 mL/min (ref >60)
GFR calc non Af Amer: >60 mL/min (ref >60)
Glucose, Bld: 102 mg/dL — ABNORMAL HIGH (ref 70–99)
Potassium: 3.9 mmol/L (ref 3.5–5.1)
Sodium: 138 mmol/L (ref 135–145)

## 2020-01-09 LAB — HCG, SERUM, QUALITATIVE: Preg, Serum: NEGATIVE

## 2020-01-09 LAB — PROTIME-INR
INR: 1 (ref 0.8–1.2)
Prothrombin Time: 12.6 seconds (ref 11.4–15.2)

## 2020-01-09 MED ORDER — SODIUM CHLORIDE 0.9% FLUSH: 9.0000 mL | INTRAVENOUS | Status: DC | PRN

## 2020-01-09 MED ORDER — DOCUSATE SODIUM 100 MG PO CAPS
100.0000 mg | ORAL_CAPSULE | Freq: Two times a day (BID) | ORAL | Status: DC
  Administered 2020-01-09 – 2020-01-10 (×2): 100 mg via ORAL
  Filled 2020-01-09 (×2): qty 1

## 2020-01-09 MED ORDER — HEPARIN SODIUM (PORCINE) 1000 UNIT/ML IJ SOLN
INTRAMUSCULAR | Status: AC
  Filled 2020-01-09: qty 1

## 2020-01-09 MED ORDER — VERAPAMIL HCL 2.5 MG/ML IV SOLN
INTRAVENOUS | Status: AC
  Filled 2020-01-09: qty 2

## 2020-01-09 MED ORDER — MIDAZOLAM HCL 2 MG/2ML IJ SOLN
INTRAMUSCULAR | Status: AC
  Filled 2020-01-09: qty 6

## 2020-01-09 MED ORDER — NALOXONE HCL 0.4 MG/ML IJ SOLN: 0.4000 mg | INTRAMUSCULAR | Status: DC | PRN

## 2020-01-09 MED ORDER — NITROGLYCERIN IN D5W 100-5 MCG/ML-% IV SOLN
INTRAVENOUS | Status: AC
  Filled 2020-01-09: qty 250

## 2020-01-09 MED ORDER — SODIUM CHLORIDE 0.9% FLUSH
3.0000 mL | Freq: Two times a day (BID) | INTRAVENOUS | Status: DC
  Administered 2020-01-10: 10:00:00 3 mL via INTRAVENOUS

## 2020-01-09 MED ORDER — KETOROLAC TROMETHAMINE 30 MG/ML IJ SOLN
INTRAMUSCULAR | Status: AC
  Filled 2020-01-09: qty 1

## 2020-01-09 MED ORDER — FENTANYL CITRATE (PF) 100 MCG/2ML IJ SOLN
INTRAMUSCULAR | Status: AC
  Filled 2020-01-09: qty 4

## 2020-01-09 MED ORDER — HYDROCODONE-ACETAMINOPHEN 5-325 MG PO TABS
1.0000 | ORAL_TABLET | ORAL | Status: DC | PRN
  Administered 2020-01-10: 06:00:00 2 via ORAL
  Administered 2020-01-10: 1 via ORAL
  Filled 2020-01-09: qty 1
  Filled 2020-01-09: qty 2

## 2020-01-09 MED ORDER — ONDANSETRON HCL 4 MG/2ML IJ SOLN
4.0000 mg | Freq: Four times a day (QID) | INTRAMUSCULAR | Status: DC | PRN
  Administered 2020-01-09 (×2): 4 mg via INTRAVENOUS
  Filled 2020-01-09: qty 2

## 2020-01-09 MED ORDER — FENTANYL CITRATE (PF) 100 MCG/2ML IJ SOLN
INTRAMUSCULAR | Status: AC | PRN
  Administered 2020-01-09 (×2): 50 ug via INTRAVENOUS

## 2020-01-09 MED ORDER — PROMETHAZINE HCL 25 MG PO TABS
25.0000 mg | ORAL_TABLET | Freq: Three times a day (TID) | ORAL | Status: DC | PRN
  Administered 2020-01-09: 18:00:00 25 mg via ORAL
  Filled 2020-01-09: qty 1

## 2020-01-09 MED ORDER — ONDANSETRON HCL 4 MG/2ML IJ SOLN
4.0000 mg | Freq: Four times a day (QID) | INTRAMUSCULAR | Status: DC | PRN
  Filled 2020-01-09: qty 2

## 2020-01-09 MED ORDER — HYDROMORPHONE 1 MG/ML IV SOLN
INTRAVENOUS | Status: DC
  Administered 2020-01-09: 2 mg via INTRAVENOUS
  Administered 2020-01-09: 30 mg via INTRAVENOUS
  Administered 2020-01-09 – 2020-01-10 (×4): 0 mg via INTRAVENOUS
  Filled 2020-01-09 (×11): qty 30

## 2020-01-09 MED ORDER — SODIUM CHLORIDE 0.9% FLUSH: 3.0000 mL | INTRAVENOUS | Status: DC | PRN

## 2020-01-09 MED ORDER — MIDAZOLAM HCL 2 MG/2ML IJ SOLN
INTRAMUSCULAR | Status: AC | PRN
  Administered 2020-01-09: 0.5 mg via INTRAVENOUS
  Administered 2020-01-09: 1 mg via INTRAVENOUS
  Administered 2020-01-09 (×2): 0.5 mg via INTRAVENOUS
  Administered 2020-01-09: 1 mg via INTRAVENOUS
  Administered 2020-01-09: 0.5 mg via INTRAVENOUS

## 2020-01-09 MED ORDER — PROMETHAZINE HCL 25 MG RE SUPP: 25.0000 mg | Freq: Three times a day (TID) | RECTAL | Status: DC | PRN

## 2020-01-09 MED ORDER — IOHEXOL 300 MG/ML  SOLN
100.0000 mL | Freq: Once | INTRAMUSCULAR | Status: AC | PRN
  Administered 2020-01-09: 80 mL via INTRA_ARTERIAL

## 2020-01-09 MED ORDER — DIPHENHYDRAMINE HCL 50 MG/ML IJ SOLN: 12.5000 mg | Freq: Four times a day (QID) | INTRAMUSCULAR | Status: DC | PRN

## 2020-01-09 MED ORDER — SODIUM CHLORIDE 0.9 % IV SOLN: INTRAVENOUS | Status: DC

## 2020-01-09 MED ORDER — VERAPAMIL HCL 2.5 MG/ML IV SOLN: INTRA_ARTERIAL | Status: AC | PRN

## 2020-01-09 MED ORDER — CEFAZOLIN SODIUM-DEXTROSE 2-4 GM/100ML-% IV SOLN: 2.0000 g | INTRAVENOUS | Status: AC

## 2020-01-09 MED ORDER — KETOROLAC TROMETHAMINE 30 MG/ML IJ SOLN
30.0000 mg | INTRAMUSCULAR | Status: AC
  Administered 2020-01-09: 10:00:00 30 mg via INTRAVENOUS
  Filled 2020-01-09: qty 1

## 2020-01-09 MED ORDER — LIDOCAINE HCL 1 % IJ SOLN
INTRAMUSCULAR | Status: AC
  Filled 2020-01-09: qty 20

## 2020-01-09 MED ORDER — IOHEXOL 300 MG/ML  SOLN
100.0000 mL | Freq: Once | INTRAMUSCULAR | Status: AC | PRN
  Administered 2020-01-09: 12:00:00 60 mL via INTRA_ARTERIAL

## 2020-01-09 MED ORDER — DIPHENHYDRAMINE HCL 12.5 MG/5ML PO ELIX
12.5000 mg | ORAL_SOLUTION | Freq: Four times a day (QID) | ORAL | Status: DC | PRN
  Filled 2020-01-09: qty 5

## 2020-01-09 MED ORDER — SODIUM CHLORIDE 0.9 % IV SOLN
250.0000 mL | INTRAVENOUS | Status: DC | PRN
  Administered 2020-01-10: 250 mL via INTRAVENOUS

## 2020-01-09 MED ORDER — CEFAZOLIN SODIUM-DEXTROSE 2-4 GM/100ML-% IV SOLN
INTRAVENOUS | Status: AC
  Administered 2020-01-09: 2 g via INTRAVENOUS
  Filled 2020-01-09: qty 100

## 2020-01-09 MED ORDER — IBUPROFEN 800 MG PO TABS
800.0000 mg | ORAL_TABLET | Freq: Four times a day (QID) | ORAL | Status: DC
  Administered 2020-01-09 – 2020-01-10 (×4): 800 mg via ORAL
  Filled 2020-01-09 (×4): qty 1

## 2020-01-09 MED ORDER — LIDOCAINE HCL (PF) 1 % IJ SOLN
INTRAMUSCULAR | Status: AC | PRN
  Administered 2020-01-09: 2 mL via INTRADERMAL

## 2020-01-09 NOTE — Progress Notes (Signed)
Patient resting comfortably in her room. Sleepy but easily arousable. Patient utilizing pain pump as appropriate. TR band still in place from procedure, scant blood under the band, no active bleeding, extremity is warm and dry. Bilateral radial pulses 2+.  Patient c/o nausea, denies vomiting. Nausea improved with PRN zofran. Ok to d/c foley per orders by Dr. Vernard Gambles. IR team will round on patient in the morning for discharge home. Please call IR with any questions or concerns.  Soyla Dryer, NP.

## 2020-01-09 NOTE — Discharge Instructions (Addendum)
Uterine Artery Embolization for Fibroids, Care After This sheet gives you information about how to care for yourself after your procedure. Your health care provider may also give you more specific instructions. If you have problems or questions, contact your health care provider. What can I expect after the procedure? After your procedure, it is common to have:  Pelvic cramping. You will be given pain medicine.  Nausea and vomiting. You may be given medicine to help relieve nausea. Follow these instructions at home: Incision care  Follow instructions from your health care provider about how to take care of your incision. Make sure you: ? Wash your hands with soap and water before you change your bandage (dressing). If soap and water are not available, use hand sanitizer. ? Change your dressing as told by your health care provider.  Check your incision area every day for signs of infection. Check for: ? More redness, swelling, or pain. ? More fluid or blood. ? Warmth. ? Pus or a bad smell. Medicines   Take over-the-counter and prescription medicines only as told by your health care provider.  Do not take aspirin. It can cause bleeding.  Do not drive for 24 hours if you were given a medicine to help you relax (sedative).  Do not drive or use heavy machinery while taking prescription pain medicine. General instructions  Ask your health care provider when you can resume sexual activity.  To prevent or treat constipation while you are taking prescription pain medicine, your health care provider may recommend that you: ? Drink enough fluid to keep your urine clear or pale yellow. ? Take over-the-counter or prescription medicines. ? Eat foods that are high in fiber, such as fresh fruits and vegetables, whole grains, and beans. ? Limit foods that are high in fat and processed sugars, such as fried and sweet foods. Contact a health care provider if:  You have a fever.  You have  more redness, swelling, or pain around your incision site.  You have more fluid or blood coming from your incision site.  Your incision feels warm to the touch.  You have pus or a bad smell coming from your incision.  You have a rash.  You have uncontrolled nausea or you cannot eat or drink anything without vomiting. Get help right away if:  You have trouble breathing.  You have chest pain.  You have severe abdominal pain.  You have leg pain.  You become dizzy and faint. Summary  After your procedure, it is common to have pelvic cramping. You will be given pain medicine.  Follow instructions from your health care provider about how to take care of your incision.  Check your incision area every day for signs of infection.  Take over-the-counter and prescription medicines only as told by your health care provider. This information is not intended to replace advice given to you by your health care provider. Make sure you discuss any questions you have with your health care provider. Document Revised: 08/18/2017 Document Reviewed: 12/08/2016 Elsevier Patient Education  [place]. Moderate Conscious Sedation, Adult, Care After These instructions provide you with information about caring for yourself after your procedure. Your health care provider may also give you more specific instructions. Your treatment has been planned according to current medical practices, but problems sometimes occur. Call your health care provider if you have any problems or questions after your procedure. What can I expect after the procedure? After your procedure, it is common:  To  feel sleepy for several hours.  To feel clumsy and have poor balance for several hours.  To have poor judgment for several hours.  To vomit if you eat too soon. Follow these instructions at home: For at least 24 hours after the procedure:   Do not: ? Participate in activities where you could fall or become  injured. ? Drive. ? Use heavy machinery. ? Drink alcohol. ? Take sleeping pills or medicines that cause drowsiness. ? Make important decisions or sign legal documents. ? Take care of children on your own.  Rest. Eating and drinking  Follow the diet recommended by your health care provider.  If you vomit: ? Drink water, juice, or soup when you can drink without vomiting. ? Make sure you have little or no nausea before eating solid foods. General instructions  Have a responsible adult stay with you until you are awake and alert.  Take over-the-counter and prescription medicines only as told by your health care provider.  If you smoke, do not smoke without supervision.  Keep all follow-up visits as told by your health care provider. This is important. Contact a health care provider if:  You keep feeling nauseous or you keep vomiting.  You feel light-headed.  You develop a rash.  You have a fever. Get help right away if:  You have trouble breathing. This information is not intended to replace advice given to you by your health care provider. Make sure you discuss any questions you have with your health care provider. Document Revised: 08/18/2017 Document Reviewed: 12/26/2015 Elsevier Patient Education  2020 [place]. Uterine Artery Embolization for Fibroids, Care After This sheet gives you information about how to care for yourself after your procedure. Your health care provider may also give you more specific instructions. If you have problems or questions, contact your health care provider. What can I expect after the procedure? After your procedure, it is common to have:  Pelvic cramping. You will be given pain medicine.  Nausea and vomiting. You may be given medicine to help relieve nausea. Follow these instructions at home: Incision care  Follow instructions from your health care provider about how to take care of your incision. Make sure you: ? Wash your  hands with soap and water before you change your bandage (dressing). If soap and water are not available, use hand sanitizer. ? Change your dressing as told by your health care provider.  Check your incision area every day for signs of infection. Check for: ? More redness, swelling, or pain. ? More fluid or blood. ? Warmth. ? Pus or a bad smell. Medicines   Take over-the-counter and prescription medicines only as told by your health care provider.  Do not take aspirin. It can cause bleeding.  Do not drive for 24 hours if you were given a medicine to help you relax (sedative).  Do not drive or use heavy machinery while taking prescription pain medicine. General instructions  Ask your health care provider when you can resume sexual activity.  To prevent or treat constipation while you are taking prescription pain medicine, your health care provider may recommend that you: ? Drink enough fluid to keep your urine clear or pale yellow. ? Take over-the-counter or prescription medicines. ? Eat foods that are high in fiber, such as fresh fruits and vegetables, whole grains, and beans. ? Limit foods that are high in fat and processed sugars, such as fried and sweet foods. Contact a health care provider if:  You  have a fever.  You have more redness, swelling, or pain around your incision site.  You have more fluid or blood coming from your incision site.  Your incision feels warm to the touch.  You have pus or a bad smell coming from your incision.  You have a rash.  You have uncontrolled nausea or you cannot eat or drink anything without vomiting. Get help right away if:  You have trouble breathing.  You have chest pain.  You have severe abdominal pain.  You have leg pain.  You become dizzy and faint. Summary  After your procedure, it is common to have pelvic cramping. You will be given pain medicine.  Follow instructions from your health care provider about how to  take care of your incision.  Check your incision area every day for signs of infection.  Take over-the-counter and prescription medicines only as told by your health care provider. This information is not intended to replace advice given to you by your health care provider. Make sure you discuss any questions you have with your health care provider. Document Revised: 08/18/2017 Document Reviewed: 12/08/2016 Elsevier Patient Education  2020 [employer].

## 2020-01-09 NOTE — H&P (Addendum)
Chief Complaint: Patient was seen in consultation today for uterine fibroids/uterine artery embolization.  Referring Physician(s): Cousins,Sheronette  Supervising Physician: Arne Cleveland  Patient Status: [facility] - Out-pt  History of Present Illness: Danielle Burns is a 49 y.o. female with a past medical history of chronic systolic HF, gestational diabetes, recurrent cystitis, and symptomatic uterine fibroids. She has had symptomatic uterine fibroids for approximately 12 years which have required blood transfusions due to anemia. She consulted with Dr. Vernard Gambles on an outpatient basis to discuss management options. At that time, patient decided to pursue uterine artery embolization for her uterine fibroids.  Patient presents today for possible image-guided pelvic arteriogram with possible uterine artery embolization. Patient awake and alert sitting in bed with no complaints at this time. Denies fever, chills, chest pain, dyspnea, abdominal pain, or headache.   Past Medical History:  Diagnosis Date  . Chronic systolic heart failure ([place]) 07/29/2015  . Gestational diabetes   . Palpitations   . Peripartum cardiomyopathy   . Recurrent UTI     Past Surgical History:  Procedure Laterality Date  . IR RADIOLOGIST EVAL & MGMT  10/03/2019  . TUBAL LIGATION      Allergies: Patient has no known allergies.  Medications: Prior to Admission medications   Medication Sig Start Date End Date Taking? Authorizing Provider  folic acid (FOLVITE) 1 MG tablet Take 2 tablets (2 mg total) by mouth daily. 09/27/19  Yes [place], Holli Humbles, NP  Multiple Vitamin (MULTIVITAMIN) tablet Take 1 tablet by mouth 2 (two) times a week. Reported on 02/20/2016   Yes [provider]  vitamin C (ASCORBIC ACID) 500 MG tablet Take 500 mg by mouth daily as needed (when feeling sick).   Yes [provider]     Family History  Problem Relation Age of Onset  . Diabetes Mother        type II  .  Hypertension Mother   . Heart failure Mother   . Parkinsonism Father   . Cancer Neg Hx        negative for breast and colon    Social History   Socioeconomic History  . Marital status: Married    Spouse name: Not on file  . Number of children: 4  . Years of education: Not on file  . Highest education level: Not on file  Occupational History  . Occupation: Programmer, multimedia: [employer]  Tobacco Use  . Smoking status: Never Smoker  . Smokeless tobacco: Never Used  Substance and Sexual Activity  . Alcohol use: No  . Drug use: Never  . Sexual activity: Yes    Birth control/protection: None  Other Topics Concern  . Not on file  Social History Narrative   Originally from [place].   Accompanied by son - [place] 3 yrs   2 sons and 2 daughters   Social Determinants of Health   Financial Resource Strain:   . Difficulty of Paying Living Expenses:   Food Insecurity:   . Worried About Charity fundraiser in the Last Year:   . Arboriculturist in the Last Year:   Transportation Needs:   . Film/video editor (Medical):   Marland Kitchen Lack of Transportation (Non-Medical):   Physical Activity:   . Days of Exercise per Week:   . Minutes of Exercise per Session:   Stress:   . Feeling of Stress :   Social Connections:   . Frequency of Communication with Friends and Family:   .  Frequency of Social Gatherings with Friends and Family:   . Attends Religious Services:   . Active Member of Clubs or Organizations:   . Attends Archivist Meetings:   Marland Kitchen Marital Status:      Review of Systems: A 12 point ROS discussed and pertinent positives are indicated in the HPI above.  All other systems are negative.  Review of Systems  Constitutional: Negative for chills and fever.  Respiratory: Negative for shortness of breath and wheezing.   Cardiovascular: Negative for chest pain and palpitations.  Gastrointestinal: Negative for abdominal pain.  Neurological: Negative for  headaches.  Psychiatric/Behavioral: Negative for behavioral problems and confusion.    Vital Signs: LMP 12/28/2019   Physical Exam Vitals and nursing note reviewed.  Constitutional:      General: She is not in acute distress.    Appearance: Normal appearance.  Cardiovascular:     Rate and Rhythm: Normal rate and regular rhythm.     Heart sounds: Normal heart sounds. No murmur.  Pulmonary:     Effort: Pulmonary effort is normal. No respiratory distress.     Breath sounds: Normal breath sounds. No wheezing.  Skin:    General: Skin is warm and dry.  Neurological:     Mental Status: She is alert and oriented to person, place, and time.  Psychiatric:        Mood and Affect: Mood normal.        Behavior: Behavior normal.      MD Evaluation Airway: WNL Heart: WNL Abdomen: WNL Chest/ Lungs: WNL ASA  Classification: 2 Mallampati/Airway Score: Two   Imaging: No results found.  Labs:  CBC: Recent Labs    09/27/19 1416 01/09/20 0940  WBC 8.6 6.1  HGB 6.9* 10.5*  HCT 24.2* 32.3*  PLT 402* 348    COAGS: No results for input(s): INR, APTT in the last 8760 hours.  BMP: Recent Labs    09/27/19 1122 01/09/20 0940  NA 138 138  K 4.0 3.9  CL 106 109  CO2 19* 23  GLUCOSE 81 102*  BUN 7 9  CALCIUM 8.6* 8.7*  CREATININE 0.65 0.75  GFRNONAA 105 >60  GFRAA 121 >60    LIVER FUNCTION TESTS: No results for input(s): BILITOT, AST, ALT, ALKPHOS, PROT, ALBUMIN in the last 8760 hours.   Assessment and Plan:  Uterine fibroids. Plan for image-guided pelvic arteriogram with possible uterine artery embolization with possible hypogastric nerve block today in IR. Patient is NPO. Afebrile and WBCs WNL. She does not take blood thinners. INR pending. Patient will be admitted post-procedure for overnight observation.  The risks and benefits of uterine artery embolization were discussed with the patient including, but not limited to bleeding, infection, vascular injury,  post operative pain, or contrast induced renal failure. This procedure involves the use of X-rays and because of the nature of the planned procedure, it is possible that we will have prolonged use of X-ray fluoroscopy. Potential radiation risks to you include (but are not limited to) the following: - A slightly elevated risk for cancer several years later in life. This risk is typically less than 0.5% percent. This risk is low in comparison to the normal incidence of human cancer, which is 33% for women and 50% for men according to the [place]. - Radiation induced injury can include skin redness, resembling a rash, tissue breakdown / ulcers and hair loss (which can be temporary or permanent).  The likelihood of either of these occurring depends  on the difficulty of the procedure and whether you are sensitive to radiation due to previous procedures, disease, or genetic conditions.  IF your procedure requires a prolonged use of radiation, you will be notified and given written instructions for further action.  It is your responsibility to monitor the irradiated area for the 2 weeks following the procedure and to notify your physician if you are concerned that you have suffered a radiation induced injury.   All of the patient's questions were answered, patient is agreeable to proceed. Consent signed and in chart.   Thank you for this interesting consult.  I greatly enjoyed meeting Markeda Romulus and look forward to participating in their care.  A copy of this report was sent to the requesting provider on this date.  Electronically Signed: Earley Abide, PA-C 01/09/2020, 10:08 AM   I spent a total of 40 Minutes in face to face in clinical consultation, greater than 50% of which was counseling/coordinating care for uterine fibroids/uterine artery embolization.

## 2020-01-09 NOTE — Procedures (Signed)
  Procedure: Bilateral uterine artery embolization via L radial EBL:   minimal Complications:  none immediate  See full dictation in [facility].  Dillard Cannon MD Main # [phone] Pager  [phone]

## 2020-01-10 ENCOUNTER — Other Ambulatory Visit (HOSPITAL_COMMUNITY): Payer: Self-pay | Admitting: Physician Assistant

## 2020-01-10 DIAGNOSIS — D259 Leiomyoma of uterus, unspecified: Secondary | ICD-10-CM | POA: Diagnosis not present

## 2020-01-10 MED ORDER — HYDROCODONE-ACETAMINOPHEN 5-325 MG PO TABS: 1.0000 | ORAL_TABLET | ORAL | 0 refills | Status: DC | PRN

## 2020-01-10 MED ORDER — OXYCODONE HCL 5 MG PO TABS: 5.0000 mg | ORAL_TABLET | ORAL | 0 refills | Status: DC | PRN

## 2020-01-10 NOTE — Discharge Summary (Signed)
Patient ID: Virtue Klint MRN: [id number] DOB/AGE: [birth year] 49 y.o.  Admit date: 01/09/2020 Discharge date: 01/10/2020  Supervising Physician: Aletta Edouard  Patient Status: [facility] - In-pt  Admission Diagnoses: Uterine Fibroids  Discharge Diagnoses:  Active Problems:   Uterine fibroid   Discharged Condition: Patient is good  Hospital Course:  Danielle Burns is a 49 y.o. female with a past medical history of chronic systolic HF, gestational diabetes, recurrent cystitis, and symptomatic uterine fibroids. She has had symptomatic uterine fibroids for approximately 12 years which have required blood transfusions due to anemia. She consulted with Dr. Vernard Gambles on an outpatient basis to discuss management options. At that time, patient decided to pursue uterine artery embolization for her uterine fibroids.  Patient presents yesterday for image-guided pelvic arteriogram with possible uterine artery embolization.    Significant Diagnostic Studies: IR Angiogram Pelvis Selective Or Supraselective  Result Date: 01/09/2020 CLINICAL DATA:  Symptomatic uterine fibroids. See previous consultation. FLUOROSCOPY TIME:  20 minutes 42 seconds;   1631 mGy EXAM: EXAM BILATERAL UTERINE ARTERY EMBOLIZATION TECHNIQUE: The procedure, risks, benefits, and alternatives were explained to the patient. Questions regarding the procedure were encouraged and answered. The patient understands and consents to the procedure. As antibiotic prophylaxis, cefazolin 2 g was ordered pre-procedure and administered intravenously within one hour of incision. Intravenous Fentanyl 180mcg and Versed 4mg  were administered as conscious sedation during continuous monitoring of the patient's level of consciousness and physiological / cardiorespiratory status by the radiology RN, with a total moderate sedation time of 79 minutes. Ultrasound survey of the left wrist was performed with images stored and sent to PACs. Diameter radial artery measured  2.5 x 2.1 mm. 1% lidocaine was used for local anesthesia. A micropuncture needle was used access the left radial artery under ultrasound. With excellent arterial blood flow returned, and an .018 micro wire was passed through the needle into the radial artery. The needle was removed, and a 90F Glidesheath Slender was placed over the wire. The inner dilator and wire were removed, and the sheath was flushed. Radial cocktail was then infused slowly, without complication. A benson wire was then used to navigate the selected 90F catheter into the descending aorta, used to selectively catheterize the left internal iliac artery for pelvic arteriography. A coaxial Renegade catheter was advanced with the angle fathom wire and used to selectively catheterize the left uterine artery. The microcatheter tip was positioned in the distal horizontal segment. Selective arteriogram confirms appropriate positioning. Distal branches of the left uterine artery were embolized with 500-700 micron Embospheres and a partial vial of 700-900 micron embospheres. Embolization continued until near stasis of flow was achieved. Microcatheter was withdrawn and a followup selective left internal iliac arteriogram was obtained. The right internal iliac artery was selectively catheterized. Again the renegade catheter with guidewire was coaxially advanced and used to selectively catheterize the right uterine artery. Confirmatory arteriogram was performed. Right uterine artery branches were embolized with 500-700 micron Embospheres to near stasis of flow. A total of 7 vials of Embospheres 500-700 micron, and a partial vial 700-900 micron were utilized for the case. Microcatheter was withdrawn and C2 catheter removed. The sheath was removed and hemostasis achieved with radial band. The patient tolerated the procedure well. COMPLICATIONS: COMPLICATIONS none IMPRESSION: 1. Technically successful bilateral uterine artery embolization using 500-900 micron  Embospheres as detailed above. Electronically Signed   By: Lucrezia Europe M.D.   On: 01/09/2020 13:45   IR Angiogram Pelvis Selective Or Supraselective  Result Date: 01/09/2020 CLINICAL DATA:  Symptomatic uterine fibroids. See previous consultation. FLUOROSCOPY TIME:  20 minutes 42 seconds;   1631 mGy EXAM: EXAM BILATERAL UTERINE ARTERY EMBOLIZATION TECHNIQUE: The procedure, risks, benefits, and alternatives were explained to the patient. Questions regarding the procedure were encouraged and answered. The patient understands and consents to the procedure. As antibiotic prophylaxis, cefazolin 2 g was ordered pre-procedure and administered intravenously within one hour of incision. Intravenous Fentanyl 142mcg and Versed 4mg  were administered as conscious sedation during continuous monitoring of the patient's level of consciousness and physiological / cardiorespiratory status by the radiology RN, with a total moderate sedation time of 79 minutes. Ultrasound survey of the left wrist was performed with images stored and sent to PACs. Diameter radial artery measured 2.5 x 2.1 mm. 1% lidocaine was used for local anesthesia. A micropuncture needle was used access the left radial artery under ultrasound. With excellent arterial blood flow returned, and an .018 micro wire was passed through the needle into the radial artery. The needle was removed, and a 69F Glidesheath Slender was placed over the wire. The inner dilator and wire were removed, and the sheath was flushed. Radial cocktail was then infused slowly, without complication. A benson wire was then used to navigate the selected 69F catheter into the descending aorta, used to selectively catheterize the left internal iliac artery for pelvic arteriography. A coaxial Renegade catheter was advanced with the angle fathom wire and used to selectively catheterize the left uterine artery. The microcatheter tip was positioned in the distal horizontal segment. Selective  arteriogram confirms appropriate positioning. Distal branches of the left uterine artery were embolized with 500-700 micron Embospheres and a partial vial of 700-900 micron embospheres. Embolization continued until near stasis of flow was achieved. Microcatheter was withdrawn and a followup selective left internal iliac arteriogram was obtained. The right internal iliac artery was selectively catheterized. Again the renegade catheter with guidewire was coaxially advanced and used to selectively catheterize the right uterine artery. Confirmatory arteriogram was performed. Right uterine artery branches were embolized with 500-700 micron Embospheres to near stasis of flow. A total of 7 vials of Embospheres 500-700 micron, and a partial vial 700-900 micron were utilized for the case. Microcatheter was withdrawn and C2 catheter removed. The sheath was removed and hemostasis achieved with radial band. The patient tolerated the procedure well. COMPLICATIONS: COMPLICATIONS none IMPRESSION: 1. Technically successful bilateral uterine artery embolization using 500-900 micron Embospheres as detailed above. Electronically Signed   By: Lucrezia Europe M.D.   On: 01/09/2020 13:45   IR Angiogram Selective Each Additional Vessel  Result Date: 01/09/2020 CLINICAL DATA:  Symptomatic uterine fibroids. See previous consultation. FLUOROSCOPY TIME:  20 minutes 42 seconds;   1631 mGy EXAM: EXAM BILATERAL UTERINE ARTERY EMBOLIZATION TECHNIQUE: The procedure, risks, benefits, and alternatives were explained to the patient. Questions regarding the procedure were encouraged and answered. The patient understands and consents to the procedure. As antibiotic prophylaxis, cefazolin 2 g was ordered pre-procedure and administered intravenously within one hour of incision. Intravenous Fentanyl 128mcg and Versed 4mg  were administered as conscious sedation during continuous monitoring of the patient's level of consciousness and physiological /  cardiorespiratory status by the radiology RN, with a total moderate sedation time of 79 minutes. Ultrasound survey of the left wrist was performed with images stored and sent to PACs. Diameter radial artery measured 2.5 x 2.1 mm. 1% lidocaine was used for local anesthesia. A micropuncture needle was used access the left radial artery under ultrasound. With excellent arterial blood flow returned, and  an .018 micro wire was passed through the needle into the radial artery. The needle was removed, and a 64F Glidesheath Slender was placed over the wire. The inner dilator and wire were removed, and the sheath was flushed. Radial cocktail was then infused slowly, without complication. A benson wire was then used to navigate the selected 64F catheter into the descending aorta, used to selectively catheterize the left internal iliac artery for pelvic arteriography. A coaxial Renegade catheter was advanced with the angle fathom wire and used to selectively catheterize the left uterine artery. The microcatheter tip was positioned in the distal horizontal segment. Selective arteriogram confirms appropriate positioning. Distal branches of the left uterine artery were embolized with 500-700 micron Embospheres and a partial vial of 700-900 micron embospheres. Embolization continued until near stasis of flow was achieved. Microcatheter was withdrawn and a followup selective left internal iliac arteriogram was obtained. The right internal iliac artery was selectively catheterized. Again the renegade catheter with guidewire was coaxially advanced and used to selectively catheterize the right uterine artery. Confirmatory arteriogram was performed. Right uterine artery branches were embolized with 500-700 micron Embospheres to near stasis of flow. A total of 7 vials of Embospheres 500-700 micron, and a partial vial 700-900 micron were utilized for the case. Microcatheter was withdrawn and C2 catheter removed. The sheath was removed and  hemostasis achieved with radial band. The patient tolerated the procedure well. COMPLICATIONS: COMPLICATIONS none IMPRESSION: 1. Technically successful bilateral uterine artery embolization using 500-900 micron Embospheres as detailed above. Electronically Signed   By: Lucrezia Europe M.D.   On: 01/09/2020 13:45   IR Angiogram Selective Each Additional Vessel  Result Date: 01/09/2020 CLINICAL DATA:  Symptomatic uterine fibroids. See previous consultation. FLUOROSCOPY TIME:  20 minutes 42 seconds;   1631 mGy EXAM: EXAM BILATERAL UTERINE ARTERY EMBOLIZATION TECHNIQUE: The procedure, risks, benefits, and alternatives were explained to the patient. Questions regarding the procedure were encouraged and answered. The patient understands and consents to the procedure. As antibiotic prophylaxis, cefazolin 2 g was ordered pre-procedure and administered intravenously within one hour of incision. Intravenous Fentanyl 121mcg and Versed 4mg  were administered as conscious sedation during continuous monitoring of the patient's level of consciousness and physiological / cardiorespiratory status by the radiology RN, with a total moderate sedation time of 79 minutes. Ultrasound survey of the left wrist was performed with images stored and sent to PACs. Diameter radial artery measured 2.5 x 2.1 mm. 1% lidocaine was used for local anesthesia. A micropuncture needle was used access the left radial artery under ultrasound. With excellent arterial blood flow returned, and an .018 micro wire was passed through the needle into the radial artery. The needle was removed, and a 64F Glidesheath Slender was placed over the wire. The inner dilator and wire were removed, and the sheath was flushed. Radial cocktail was then infused slowly, without complication. A benson wire was then used to navigate the selected 64F catheter into the descending aorta, used to selectively catheterize the left internal iliac artery for pelvic arteriography. A coaxial  Renegade catheter was advanced with the angle fathom wire and used to selectively catheterize the left uterine artery. The microcatheter tip was positioned in the distal horizontal segment. Selective arteriogram confirms appropriate positioning. Distal branches of the left uterine artery were embolized with 500-700 micron Embospheres and a partial vial of 700-900 micron embospheres. Embolization continued until near stasis of flow was achieved. Microcatheter was withdrawn and a followup selective left internal iliac arteriogram was obtained. The right  internal iliac artery was selectively catheterized. Again the renegade catheter with guidewire was coaxially advanced and used to selectively catheterize the right uterine artery. Confirmatory arteriogram was performed. Right uterine artery branches were embolized with 500-700 micron Embospheres to near stasis of flow. A total of 7 vials of Embospheres 500-700 micron, and a partial vial 700-900 micron were utilized for the case. Microcatheter was withdrawn and C2 catheter removed. The sheath was removed and hemostasis achieved with radial band. The patient tolerated the procedure well. COMPLICATIONS: COMPLICATIONS none IMPRESSION: 1. Technically successful bilateral uterine artery embolization using 500-900 micron Embospheres as detailed above. Electronically Signed   By: Lucrezia Europe M.D.   On: 01/09/2020 13:45   IR US Guide Vasc Access Left  Result Date: 01/09/2020 CLINICAL DATA:  Symptomatic uterine fibroids. See previous consultation. FLUOROSCOPY TIME:  20 minutes 42 seconds;   1631 mGy EXAM: EXAM BILATERAL UTERINE ARTERY EMBOLIZATION TECHNIQUE: The procedure, risks, benefits, and alternatives were explained to the patient. Questions regarding the procedure were encouraged and answered. The patient understands and consents to the procedure. As antibiotic prophylaxis, cefazolin 2 g was ordered pre-procedure and administered intravenously within one hour of  incision. Intravenous Fentanyl 167mcg and Versed 4mg  were administered as conscious sedation during continuous monitoring of the patient's level of consciousness and physiological / cardiorespiratory status by the radiology RN, with a total moderate sedation time of 79 minutes. Ultrasound survey of the left wrist was performed with images stored and sent to PACs. Diameter radial artery measured 2.5 x 2.1 mm. 1% lidocaine was used for local anesthesia. A micropuncture needle was used access the left radial artery under ultrasound. With excellent arterial blood flow returned, and an .018 micro wire was passed through the needle into the radial artery. The needle was removed, and a 722F Glidesheath Slender was placed over the wire. The inner dilator and wire were removed, and the sheath was flushed. Radial cocktail was then infused slowly, without complication. A benson wire was then used to navigate the selected 722F catheter into the descending aorta, used to selectively catheterize the left internal iliac artery for pelvic arteriography. A coaxial Renegade catheter was advanced with the angle fathom wire and used to selectively catheterize the left uterine artery. The microcatheter tip was positioned in the distal horizontal segment. Selective arteriogram confirms appropriate positioning. Distal branches of the left uterine artery were embolized with 500-700 micron Embospheres and a partial vial of 700-900 micron embospheres. Embolization continued until near stasis of flow was achieved. Microcatheter was withdrawn and a followup selective left internal iliac arteriogram was obtained. The right internal iliac artery was selectively catheterized. Again the renegade catheter with guidewire was coaxially advanced and used to selectively catheterize the right uterine artery. Confirmatory arteriogram was performed. Right uterine artery branches were embolized with 500-700 micron Embospheres to near stasis of flow. A total of  7 vials of Embospheres 500-700 micron, and a partial vial 700-900 micron were utilized for the case. Microcatheter was withdrawn and C2 catheter removed. The sheath was removed and hemostasis achieved with radial band. The patient tolerated the procedure well. COMPLICATIONS: COMPLICATIONS none IMPRESSION: 1. Technically successful bilateral uterine artery embolization using 500-900 micron Embospheres as detailed above. Electronically Signed   By: Lucrezia Europe M.D.   On: 01/09/2020 13:45   IR EMBO TUMOR ORGAN ISCHEMIA INFARCT INC GUIDE ROADMAPPING  Result Date: 01/09/2020 CLINICAL DATA:  Symptomatic uterine fibroids. See previous consultation. FLUOROSCOPY TIME:  20 minutes 42 seconds;   1631 mGy EXAM: EXAM  BILATERAL UTERINE ARTERY EMBOLIZATION TECHNIQUE: The procedure, risks, benefits, and alternatives were explained to the patient. Questions regarding the procedure were encouraged and answered. The patient understands and consents to the procedure. As antibiotic prophylaxis, cefazolin 2 g was ordered pre-procedure and administered intravenously within one hour of incision. Intravenous Fentanyl 157mcg and Versed 4mg  were administered as conscious sedation during continuous monitoring of the patient's level of consciousness and physiological / cardiorespiratory status by the radiology RN, with a total moderate sedation time of 79 minutes. Ultrasound survey of the left wrist was performed with images stored and sent to PACs. Diameter radial artery measured 2.5 x 2.1 mm. 1% lidocaine was used for local anesthesia. A micropuncture needle was used access the left radial artery under ultrasound. With excellent arterial blood flow returned, and an .018 micro wire was passed through the needle into the radial artery. The needle was removed, and a 42F Glidesheath Slender was placed over the wire. The inner dilator and wire were removed, and the sheath was flushed. Radial cocktail was then infused slowly, without  complication. A benson wire was then used to navigate the selected 42F catheter into the descending aorta, used to selectively catheterize the left internal iliac artery for pelvic arteriography. A coaxial Renegade catheter was advanced with the angle fathom wire and used to selectively catheterize the left uterine artery. The microcatheter tip was positioned in the distal horizontal segment. Selective arteriogram confirms appropriate positioning. Distal branches of the left uterine artery were embolized with 500-700 micron Embospheres and a partial vial of 700-900 micron embospheres. Embolization continued until near stasis of flow was achieved. Microcatheter was withdrawn and a followup selective left internal iliac arteriogram was obtained. The right internal iliac artery was selectively catheterized. Again the renegade catheter with guidewire was coaxially advanced and used to selectively catheterize the right uterine artery. Confirmatory arteriogram was performed. Right uterine artery branches were embolized with 500-700 micron Embospheres to near stasis of flow. A total of 7 vials of Embospheres 500-700 micron, and a partial vial 700-900 micron were utilized for the case. Microcatheter was withdrawn and C2 catheter removed. The sheath was removed and hemostasis achieved with radial band. The patient tolerated the procedure well. COMPLICATIONS: COMPLICATIONS none IMPRESSION: 1. Technically successful bilateral uterine artery embolization using 500-900 micron Embospheres as detailed above. Electronically Signed   By: Lucrezia Europe M.D.   On: 01/09/2020 13:45    Treatments: Uterine artery embolization  Discharge Exam: Blood pressure (!) 141/81, pulse 67, temperature 98.4 F (36.9 C), temperature source Oral, resp. rate 15, last menstrual period 12/28/2019, SpO2 97 %. Physical Exam Constitutional:      Appearance: Normal appearance.  Cardiovascular:     Comments: Radial artery site clean, dry without  swelling or erythema Pulmonary:     Effort: Pulmonary effort is normal.  Genitourinary:    Comments: Patient  Ambulating to the bathroom to void Musculoskeletal:        General: Normal range of motion.  Neurological:     General: No focal deficit present.     Mental Status: She is alert and oriented to person, place, and time.  Psychiatric:        Mood and Affect: Mood normal.        Behavior: Behavior normal.        Thought Content: Thought content normal.        Judgment: Judgment normal.     Disposition: Discharge home and our office will call the patient to set up a  televisit in 4 weeks.        Electronically Signed: Theresa Duty, NP 01/10/2020, 11:07 AM   I have spent 30 min discharging Danielle Burns.

## 2020-01-14 ENCOUNTER — Other Ambulatory Visit: Payer: Self-pay | Admitting: Interventional Radiology

## 2020-01-14 DIAGNOSIS — D259 Leiomyoma of uterus, unspecified: Secondary | ICD-10-CM

## 2020-02-18 ENCOUNTER — Ambulatory Visit
Admission: RE | Admit: 2020-02-18 | Discharge: 2020-02-18 | Disposition: A | Discharge status: Home / Self Care | Payer: Commercial Managed Care - PPO | Source: Ambulatory Visit | Attending: Interventional Radiology | Admitting: Interventional Radiology

## 2020-02-18 ENCOUNTER — Other Ambulatory Visit: Payer: Self-pay

## 2020-02-18 ENCOUNTER — Encounter: Payer: Self-pay | Admitting: *Deleted

## 2020-02-18 DIAGNOSIS — D259 Leiomyoma of uterus, unspecified: Secondary | ICD-10-CM

## 2020-02-18 HISTORY — PX: IR RADIOLOGIST EVAL & MGMT: IMG5224

## 2020-02-18 NOTE — Progress Notes (Signed)
Patient ID: Danielle Burns, female   DOB: 1970/10/01, 49 y.o.   MRN: [id number]       Chief Complaint: Patient was consulted remotely today ([place]) for follow-up uterine fibroid embolization at the request of Stanislaw Acton.    Referring Physician(s): Cousins,Sheronette    History of Present Illness: Danielle Burns is a 49 y.o. female who was diagnosed approximately 12 years ago with uterine fibroids, at the time of childbirth.  Over the past year, she   had progressive menorrhagia with anemia, severe enough to require iron transfusion.  G4 P4 with no plans for future pregnancy.  No perimenopausal symptoms.  Preop   menstrual cycle  frequency occurring monthly, usually lasting about 7 days with 3 days of heavy bleeding requiring changing both pads and tampons every 30-60 minutes during the heavy days, with no interperiod Bleeding.  She only endorses urinary frequency as a fibroid bulk symptoms.  No previous fibroid surgeries.  No history of gynecologic infections. 09/04/2019 Pap smear  negative. 09/04/2019 Ultrasound showed uterine fibroids measuring up to 7 cm.  09/19/2019 Endometrial biopsy  negative. 10/16/2018 MR pelvis shows numerous uterine leiomyomata  01/09/2020 patient underwent successful technically successful uterine fibroid embolization, now with overnight observation, discharged the following morning without complication.  She states that she did well after discharge.  Pelvic pain tapered off over the next 10 days.  She is returned to full activity including work.  Her uterine bleeding has significantly improved although she still has some residual  occasional spotting.  No fevers, no worsening abdominal pain, nor passage of tissue fragments.     Past Medical History:  Diagnosis Date  . Chronic systolic heart failure ([place]) 07/29/2015  . Gestational diabetes   . Palpitations   . Peripartum cardiomyopathy   . Recurrent UTI     Past Surgical History:  Procedure Laterality Date    . IR ANGIOGRAM PELVIS SELECTIVE OR SUPRASELECTIVE  01/09/2020  . IR ANGIOGRAM PELVIS SELECTIVE OR SUPRASELECTIVE  01/09/2020  . IR ANGIOGRAM SELECTIVE EACH ADDITIONAL VESSEL  01/09/2020  . IR ANGIOGRAM SELECTIVE EACH ADDITIONAL VESSEL  01/09/2020  . IR EMBO TUMOR ORGAN ISCHEMIA INFARCT INC GUIDE ROADMAPPING  01/09/2020  . IR RADIOLOGIST EVAL & MGMT  10/03/2019  . IR US GUIDE VASC ACCESS LEFT  01/09/2020  . TUBAL LIGATION      Allergies: Patient has no known allergies.  Medications: Prior to Admission medications   Medication Sig Start Date End Date Taking? Authorizing Provider  cetirizine (ZYRTEC) 10 MG tablet Take 10 mg by mouth daily as needed for allergies.    [provider]  fluticasone (FLONASE) 50 MCG/ACT nasal spray Place 1-2 sprays into both nostrils daily as needed for allergies or rhinitis.    [provider]  folic acid (FOLVITE) 1 MG tablet Take 2 tablets (2 mg total) by mouth daily. 09/27/19   [place], Holli Humbles, NP  Multiple Vitamin (MULTIVITAMIN) tablet Take 1 tablet by mouth 2 (two) times a week. Reported on 02/20/2016    [provider]  omeprazole (PRILOSEC) 40 MG capsule Take 40 mg by mouth daily. 12/02/19   [provider]  oxyCODONE (ROXICODONE) 5 MG immediate release tablet Take 1 tablet (5 mg total) by mouth every 4 (four) hours as needed for up to 20 doses for severe pain. 01/10/20   Ardis Rowan, PA-C  vitamin C (ASCORBIC ACID) 500 MG tablet Take 500 mg by mouth daily as needed (when feeling sick).    [provider]  Family History  Problem Relation Age of Onset  . Diabetes Mother        type II  . Hypertension Mother   . Heart failure Mother   . Parkinsonism Father   . Cancer Neg Hx        negative for breast and colon    Social History   Socioeconomic History  . Marital status: Married    Spouse name: Not on file  . Number of children: 4  . Years of education: Not on file  . Highest education level:  Not on file  Occupational History  . Occupation: Programmer, multimedia: [employer]  Tobacco Use  . Smoking status: Never Smoker  . Smokeless tobacco: Never Used  Substance and Sexual Activity  . Alcohol use: No  . Drug use: Never  . Sexual activity: Yes    Birth control/protection: None  Other Topics Concern  . Not on file  Social History Narrative   Originally from [place].   Accompanied by son - Sulamon 3 yrs   2 sons and 2 daughters   Social Determinants of Health   Financial Resource Strain:   . Difficulty of Paying Living Expenses:   Food Insecurity:   . Worried About Charity fundraiser in the Last Year:   . Arboriculturist in the Last Year:   Transportation Needs:   . Film/video editor (Medical):   Marland Kitchen Lack of Transportation (Non-Medical):   Physical Activity:   . Days of Exercise per Week:   . Minutes of Exercise per Session:   Stress:   . Feeling of Stress :   Social Connections:   . Frequency of Communication with Friends and Family:   . Frequency of Social Gatherings with Friends and Family:   . Attends Religious Services:   . Active Member of Clubs or Organizations:   . Attends Archivist Meetings:   Marland Kitchen Marital Status:     ECOG Status: 1 - Symptomatic but completely ambulatory  Review of Systems  Review of Systems: A 12 point ROS discussed and pertinent positives are indicated in the HPI above.  All other systems are negative.  Physical Exam No direct physical exam was performed (except for noted visual exam findings with Video Visits).     Vital Signs: There were no vitals taken for this visit.  Imaging: No results found.  Labs:  CBC: Recent Labs    09/27/19 1416 01/09/20 0940  WBC 8.6 6.1  HGB 6.9* 10.5*  HCT 24.2* 32.3*  PLT 402* 348    COAGS: Recent Labs    01/09/20 0940  INR 1.0    BMP: Recent Labs    09/27/19 1122 01/09/20 0940  NA 138 138  K 4.0 3.9  CL 106 109  CO2 19* 23  GLUCOSE 81 102*    BUN 7 9  CALCIUM 8.6* 8.7*  CREATININE 0.65 0.75  GFRNONAA 105 >60  GFRAA 121 >60    LIVER FUNCTION TESTS: No results for input(s): BILITOT, AST, ALT, ALKPHOS, PROT, ALBUMIN in the last 8760 hours.  TUMOR MARKERS: No results for input(s): AFPTM, CEA, CA199, CHROMGRNA in the last 8760 hours.  Assessment and Plan: My impression is that this patient has done very well in the first month post uterine fibroid embolization.  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  She is return to full activity as expected.  I explained that there can be occasional spotting  during her menstrual cycles for the first few months post UFE, which will hopefully normalize.  I reviewed that the fibroids will continue to involute over the next 3-6 months with continued improvement in symptoms.  She knows to call right away should she notice passage of any obvious tissue fragments which might suggest fibroid fragmentation instead of involution.  Otherwise, she can continue with full activity, no restrictions.  We will see her back at the 26-month mark with an MR to confirm involution of her fibroids.  She knows to call in the interval with any questions or problems.  Thank you for this interesting consult.  I greatly enjoyed meeting Danielle Burns and look forward to participating in their care.  A copy of this report was sent to the requesting provider on this date.  Electronically Signed: Rickard Rhymes 02/18/2020, 1:22 PM   I spent a total of    25 Minutes in remote  clinical consultation, greater than 50% of which was counseling/coordinating care for symptomatic uterine fibroids, post embolization.    Visit type: Audio only (telephone). Audio (no video) only due to patient's lack of internet/smartphone capability. Alternative for in-person consultation at [facility], [place], [place], [place]. This visit type was conducted due to national  recommendations for restrictions regarding the COVID-19 Pandemic (e.g. social distancing).  This format is felt to be most appropriate for this patient at this time.  All issues noted in this document were discussed and addressed.

## 2020-07-15 ENCOUNTER — Other Ambulatory Visit: Payer: Self-pay | Admitting: Interventional Radiology

## 2020-07-15 DIAGNOSIS — D250 Submucous leiomyoma of uterus: Secondary | ICD-10-CM

## 2021-02-23 ENCOUNTER — Ambulatory Visit (INDEPENDENT_AMBULATORY_CARE_PROVIDER_SITE_OTHER): Payer: Commercial Managed Care - PPO | Admitting: Family Medicine

## 2021-02-23 ENCOUNTER — Other Ambulatory Visit: Payer: Self-pay

## 2021-02-23 ENCOUNTER — Encounter: Payer: Self-pay | Admitting: Family Medicine

## 2021-02-23 ENCOUNTER — Ambulatory Visit (HOSPITAL_BASED_OUTPATIENT_CLINIC_OR_DEPARTMENT_OTHER)
Admission: RE | Admit: 2021-02-23 | Discharge: 2021-02-23 | Disposition: A | Discharge status: Home / Self Care | Payer: Commercial Managed Care - PPO | Source: Ambulatory Visit | Attending: Family Medicine | Admitting: Family Medicine

## 2021-02-23 VITALS — BP 120/80 | HR 82 | Temp 99.4°F | Resp 18 | Ht 63.0 in | Wt 201.2 lb

## 2021-02-23 DIAGNOSIS — R202 Paresthesia of skin: Secondary | ICD-10-CM

## 2021-02-23 DIAGNOSIS — M79661 Pain in right lower leg: Secondary | ICD-10-CM | POA: Insufficient documentation

## 2021-02-23 DIAGNOSIS — R200 Anesthesia of skin: Secondary | ICD-10-CM | POA: Insufficient documentation

## 2021-02-23 DIAGNOSIS — M79662 Pain in left lower leg: Secondary | ICD-10-CM | POA: Diagnosis present

## 2021-02-23 DIAGNOSIS — M791 Myalgia, unspecified site: Secondary | ICD-10-CM | POA: Diagnosis not present

## 2021-02-23 NOTE — Progress Notes (Signed)
Subjective:   By signing my name below, I, Danielle Burns, attest that this documentation has been prepared under the direction and in the presence of Dr. Roma Schanz, DO. 02/23/2021    Patient ID: Danielle Burns, female    DOB: [birth year], 50 y.o.   MRN: [id number]  Chief Complaint  Patient presents with  . Arm Pain  . Foot Pain    X1 year for both, pt states pain is getting worse. Pt states no swelling    HPI Patient is in today for a office visit. She complains of progressive burning pain in her hands and feet since her last visit. She feels the pain while driving and sleeping. She sleeps on her side. When she has a episode of pain while driving she shakes the affected limb for a while before the pain resolves. Her calves are tender when the episodes of pain starts. After working 12 hour shifts she gets slight swelling in her legs. Her pain does not worsen while on the computer at work.  She continues to see her oncologist to manage her anemia. She continues to have menstrual cycles and loses less blood since her last visit. She has not had her vitamin B12 measured recently. She has gestational diabetes but does not normally have high blood sugar. She denies having any joint pain, chest pain, or shortness of breath at this time. 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   Past Medical History:  Diagnosis Date  . Chronic systolic heart failure ([place]) 07/29/2015  . Gestational diabetes   . Palpitations   . Peripartum cardiomyopathy   . Recurrent UTI     Past Surgical History:  Procedure Laterality Date  . IR ANGIOGRAM PELVIS SELECTIVE OR SUPRASELECTIVE  01/09/2020  . IR ANGIOGRAM PELVIS SELECTIVE OR SUPRASELECTIVE  01/09/2020  . IR ANGIOGRAM SELECTIVE EACH ADDITIONAL VESSEL  01/09/2020  . IR ANGIOGRAM SELECTIVE EACH ADDITIONAL VESSEL  01/09/2020  . IR EMBO TUMOR ORGAN ISCHEMIA INFARCT INC GUIDE ROADMAPPING  01/09/2020  . IR RADIOLOGIST EVAL & MGMT  10/03/2019  .  IR RADIOLOGIST EVAL & MGMT  02/18/2020  . IR US GUIDE VASC ACCESS LEFT  01/09/2020  . TUBAL LIGATION      Family History  Problem Relation Age of Onset  . Diabetes Mother        type II  . Hypertension Mother   . Heart failure Mother   . Parkinsonism Father   . Cancer Neg Hx        negative for breast and colon    Social History   Socioeconomic History  . Marital status: Married    Spouse name: Not on file  . Number of children: 4  . Years of education: Not on file  . Highest education level: Not on file  Occupational History  . Occupation: Programmer, multimedia: [employer]  Tobacco Use  . Smoking status: Never Smoker  . Smokeless tobacco: Never Used  Vaping Use  . Vaping Use: Never used  Substance and Sexual Activity  . Alcohol use: No  . Drug use: Never  . Sexual activity: Yes    Birth control/protection: None  Other Topics Concern  . Not on file  Social History Narrative   Originally from [place].   Accompanied by son - Sulamon 3 yrs   2 sons and 2 daughters   Social Determinants of Health   Financial Resource Strain: Not on file  Food Insecurity: Not on file  Transportation Needs: Not on file  Physical Activity: Not on file  Stress: Not on file  Social Connections: Not on file  Intimate Partner Violence: Not on file    Outpatient Medications Prior to Visit  Medication Sig Dispense Refill  . cetirizine (ZYRTEC) 10 MG tablet Take 10 mg by mouth daily as needed for allergies.    . fluticasone (FLONASE) 50 MCG/ACT nasal spray Place 1-2 sprays into both nostrils daily as needed for allergies or rhinitis.    . Multiple Vitamin (MULTIVITAMIN) tablet Take 1 tablet by mouth 2 (two) times a week. Reported on 11/24/8586    . folic acid (FOLVITE) 1 MG tablet Take 2 tablets (2 mg total) by mouth daily. (Patient not taking: Reported on 02/23/2021) 60 tablet 11  . omeprazole (PRILOSEC) 40 MG capsule Take 40 mg by mouth daily. (Patient not taking: Reported on 02/23/2021)     . oxyCODONE (ROXICODONE) 5 MG immediate release tablet Take 1 tablet (5 mg total) by mouth every 4 (four) hours as needed for up to 20 doses for severe pain. (Patient not taking: Reported on 02/23/2021) 20 tablet 0  . vitamin C (ASCORBIC ACID) 500 MG tablet Take 500 mg by mouth daily as needed (when feeling sick). (Patient not taking: Reported on 02/23/2021)     No facility-administered medications prior to visit.    No Known Allergies  Review of Systems  Constitutional: Negative for chills, fever and malaise/fatigue.  HENT: Negative for congestion and hearing loss.   Eyes: Negative for discharge.  Respiratory: Negative for cough, sputum production and shortness of breath.   Cardiovascular: Negative for chest pain, palpitations and leg swelling.  Gastrointestinal: Negative for abdominal pain, blood in stool, constipation, diarrhea, heartburn, nausea and vomiting.  Genitourinary: Negative for dysuria, frequency, hematuria and urgency.  Musculoskeletal: Positive for myalgias (hands and feet). Negative for back pain, falls and joint pain.  Skin: Negative for rash.  Neurological: Negative for dizziness, sensory change, loss of consciousness, weakness and headaches.  Endo/Heme/Allergies: Negative for environmental allergies. Does not bruise/bleed easily.  Psychiatric/Behavioral: Negative for depression and suicidal ideas. The patient is not nervous/anxious and does not have insomnia.        Objective:    Physical Exam Vitals and nursing note reviewed.  Constitutional:      General: She is not in acute distress.    Appearance: Normal appearance. She is not ill-appearing.  HENT:     Head: Normocephalic and atraumatic.     Right Ear: External ear normal.     Left Ear: External ear normal.  Eyes:     Extraocular Movements: Extraocular movements intact.     Pupils: Pupils are equal, round, and reactive to light.  Cardiovascular:     Rate and Rhythm: Normal rate and regular rhythm.      Pulses: Normal pulses.     Heart sounds: Normal heart sounds. No murmur heard. No gallop.   Pulmonary:     Effort: Pulmonary effort is normal. No respiratory distress.     Breath sounds: Normal breath sounds. No wheezing, rhonchi or rales.  Musculoskeletal:     Right lower leg: Tenderness present.     Left lower leg: Tenderness present.       Legs:  Skin:    General: Skin is warm and dry.  Neurological:     Mental Status: She is alert and oriented to person, place, and time.     Motor: No weakness.  Psychiatric:  Behavior: Behavior normal.     BP 120/80 (BP Location: Right Arm, Patient Position: Sitting, Cuff Size: Large)   Pulse 82   Temp 99.4 F (37.4 C) (Oral)   Resp 18   Ht 5\' 3"  (1.6 m)   Wt 201 lb 3.2 oz (91.3 kg)   SpO2 97%   BMI 35.64 kg/m  Wt Readings from Last 3 Encounters:  02/23/21 201 lb 3.2 oz (91.3 kg)  09/27/19 203 lb 12.8 oz (92.4 kg)  09/27/19 203 lb (92.1 kg)    Diabetic Foot Exam - Simple   No data filed    Lab Results  Component Value Date   WBC 6.1 01/09/2020   HGB 10.5 (L) 01/09/2020   HCT 32.3 (L) 01/09/2020   PLT 348 01/09/2020   GLUCOSE 102 (H) 01/09/2020   CHOL 172 06/04/2015   TRIG 96.0 06/04/2015   HDL 40.50 06/04/2015   LDLCALC 112 (H) 06/04/2015   ALT 14 04/19/2018   AST 18 04/19/2018   NA 138 01/09/2020   K 3.9 01/09/2020   CL 109 01/09/2020   CREATININE 0.75 01/09/2020   BUN 9 01/09/2020   CO2 23 01/09/2020   TSH 0.61 06/04/2015   INR 1.0 01/09/2020   HGBA1C 5.7 01/07/2009    Lab Results  Component Value Date   TSH 0.61 06/04/2015   Lab Results  Component Value Date   WBC 6.1 01/09/2020   HGB 10.5 (L) 01/09/2020   HCT 32.3 (L) 01/09/2020   MCV 83.9 01/09/2020   PLT 348 01/09/2020   Lab Results  Component Value Date   NA 138 01/09/2020   K 3.9 01/09/2020   CO2 23 01/09/2020   GLUCOSE 102 (H) 01/09/2020   BUN 9 01/09/2020   CREATININE 0.75 01/09/2020   BILITOT 0.3 04/19/2018   ALKPHOS 72  04/19/2018   AST 18 04/19/2018   ALT 14 04/19/2018   PROT 8.1 04/19/2018   ALBUMIN 3.6 04/19/2018   CALCIUM 8.7 (L) 01/09/2020   ANIONGAP 6 01/09/2020   GFR 89.66 06/04/2015   Lab Results  Component Value Date   CHOL 172 06/04/2015   Lab Results  Component Value Date   HDL 40.50 06/04/2015   Lab Results  Component Value Date   LDLCALC 112 (H) 06/04/2015   Lab Results  Component Value Date   TRIG 96.0 06/04/2015   Lab Results  Component Value Date   CHOLHDL 4 06/04/2015   Lab Results  Component Value Date   HGBA1C 5.7 01/07/2009       Assessment & Plan:   Problem List Items Addressed This Visit      Unprioritized   Bilateral calf pain    Doubt dvt Check [place] both low ext       Relevant Orders   US Venous Img Lower Bilateral (DVT)   Myalgia    Check labs       Relevant Orders   CBC with Differential/Platelet   TSH   IBC + Ferritin   Antinuclear Antib (ANA)   Sedimentation rate   Rheumatoid Factor   Vitamin B12   Hemoglobin A1c   Vitamin D (25 hydroxy)   Numbness and tingling in both hands - Primary    prob CTS  Refer to hand specialist      Relevant Orders   CBC with Differential/Platelet   TSH   IBC + Ferritin   Antinuclear Antib (ANA)   Sedimentation rate   Rheumatoid Factor   Vitamin B12   Hemoglobin A1c  Vitamin D (25 hydroxy)   Ambulatory referral to Orthopedic Surgery       No orders of the defined types were placed in this encounter.   I, Dr. Roma Schanz, DO, personally preformed the services described in this documentation.  All medical record entries made by the scribe were at my direction and in my presence.  I have reviewed the chart and discharge instructions (if applicable) and agree that the record reflects my personal performance and is accurate and complete. 02/23/2021   I,Danielle Burns,acting as a scribe for Ann Held, DO.,have documented all relevant documentation on the behalf of Ann Held, DO,as directed by  Ann Held, DO while in the presence of Ann Held, DO.   Ann Held, DO

## 2021-02-23 NOTE — Assessment & Plan Note (Signed)
prob CTS  Refer to hand specialist

## 2021-02-23 NOTE — Assessment & Plan Note (Signed)
Check labs 

## 2021-02-23 NOTE — Assessment & Plan Note (Signed)
Doubt dvt Check [place] both low ext

## 2021-02-23 NOTE — Patient Instructions (Signed)
Orthopedic physical assessment (7th ed., pp. 164- 242). Elsevier.">  Paresthesia Paresthesia is an abnormal burning or prickling sensation. It is usually felt in the hands, arms, legs, or feet. However, it may occur in any part of the body. Usually, paresthesia is not painful. It may feel like:  Tingling or numbness.  Buzzing.  Itching. Paresthesia may occur without any clear cause, or it may be caused by:  Breathing too quickly (hyperventilation).  Pressure on a nerve.  An underlying medical condition.  Side effects of a medication.  Nutritional deficiencies.  Exposure to toxic chemicals. Most people experience temporary (transient) paresthesia at some time in their lives. For some people, it may be long-lasting (chronic) because of an underlying medical condition. If you have paresthesia that lasts a long time, you need to be evaluated by your health care provider. Follow these instructions at home: Alcohol use  Do not drink alcohol if: ? Your health care provider tells you not to drink. ? You are pregnant, may be pregnant, or are planning to become pregnant.  If you drink alcohol: ? Limit how much you use to:  0-1 drink a day for women.  0-2 drinks a day for men. ? Be aware of how much alcohol is in your drink. In the [place] one drink equals one 12 oz bottle of beer (355 mL), one 5 oz glass of wine (148 mL), or one 1 oz glass of hard liquor (44 mL).   Nutrition  Eat a healthy diet. This includes: ? Eating foods that are high in fiber, such as fresh fruits and vegetables, whole grains, and beans. ? Limiting foods that are high in fat and processed sugars, such as fried or sweet foods.   General instructions  Take over-the-counter and prescription medicines only as told by your health care provider.  Do not use any products that contain nicotine or tobacco, such as cigarettes and e-cigarettes. These can keep blood from reaching damaged nerves. If you need help quitting,  ask your health care provider.  If you have diabetes, work closely with your health care provider to keep your blood sugar under control.  If you have numbness in your feet: ? Check every day for signs of injury or infection. Watch for redness, warmth, and swelling. ? Wear padded socks and comfortable shoes. These help protect your feet.  Keep all follow-up visits as told by your health care provider. This is important. Contact a health care provider if you:  Have paresthesia that gets worse or does not go away.  Have numbness after an injury.  Have a burning or prickling feeling that gets worse when you walk.  Have pain, cramps, or dizziness, or you faint.  Develop a rash. Get help right away if you:  Feel muscle weakness.  Develop new weakness in an arm or leg.  Have trouble walking or moving.  Have problems with speech, understanding, or vision.  Feel confused.  Cannot control your bladder or bowel movements. Summary  Paresthesia is an abnormal burning or prickling sensation that is usually felt in the hands, arms, legs, or feet. It may also occur in other parts of the body.  Paresthesia may occur without any clear cause, or it may be caused by breathing too quickly (hyperventilation), pressure on a nerve, an underlying medical condition, side effects of a medication, nutritional deficiencies, or exposure to toxic chemicals.  If you have paresthesia that lasts a long time, you need to be evaluated by your health care provider.  This information is not intended to replace advice given to you by your health care provider. Make sure you discuss any questions you have with your health care provider. Document Revised: 06/16/2020 Document Reviewed: 06/16/2020 Elsevier Patient Education  2021 [employer].

## 2021-02-24 LAB — IBC + FERRITIN
Ferritin: 6.5 ng/mL — ABNORMAL LOW (ref 10.0–291.0)
Iron: 78 ug/dL (ref 42–145)
Saturation Ratios: 16.2 % — ABNORMAL LOW (ref 20.0–50.0)
Transferrin: 343 mg/dL (ref 212.0–360.0)

## 2021-02-24 LAB — VITAMIN B12: Vitamin B-12: 1056 pg/mL — ABNORMAL HIGH (ref 211–911)

## 2021-02-24 LAB — CBC WITH DIFFERENTIAL/PLATELET
Basophils Absolute: 0.1 10*3/uL (ref 0.0–0.1)
Basophils Relative: 1.2 % (ref 0.0–3.0)
Eosinophils Absolute: 0.3 10*3/uL (ref 0.0–0.7)
Eosinophils Relative: 5.3 % — ABNORMAL HIGH (ref 0.0–5.0)
HCT: 36.1 % (ref 36.0–46.0)
Hemoglobin: 12 g/dL (ref 12.0–15.0)
Lymphocytes Relative: 42 % (ref 12.0–46.0)
Lymphs Abs: 2.3 10*3/uL (ref 0.7–4.0)
MCHC: 33.4 g/dL (ref 30.0–36.0)
MCV: 82.1 fl (ref 78.0–100.0)
Monocytes Absolute: 0.5 10*3/uL (ref 0.1–1.0)
Monocytes Relative: 8.5 % (ref 3.0–12.0)
Neutro Abs: 2.4 10*3/uL (ref 1.4–7.7)
Neutrophils Relative %: 43 % (ref 43.0–77.0)
Platelets: 310 10*3/uL (ref 150.0–400.0)
RBC: 4.39 Mil/uL (ref 3.87–5.11)
RDW: 14.3 % (ref 11.5–15.5)
WBC: 5.6 10*3/uL (ref 4.0–10.5)

## 2021-02-24 LAB — TSH: TSH: 0.44 u[IU]/mL (ref 0.35–4.50)

## 2021-02-24 LAB — HEMOGLOBIN A1C: Hgb A1c MFr Bld: 6.1 % (ref 4.6–6.5)

## 2021-02-24 LAB — ANA: Anti Nuclear Antibody (ANA): NEGATIVE

## 2021-02-24 LAB — SEDIMENTATION RATE: Sed Rate: 34 mm/hr — ABNORMAL HIGH (ref 0–30)

## 2021-02-24 LAB — VITAMIN D 25 HYDROXY (VIT D DEFICIENCY, FRACTURES): VITD: 23.13 ng/mL — ABNORMAL LOW (ref 30.00–100.00)

## 2021-02-24 LAB — RHEUMATOID FACTOR: Rheumatoid fact SerPl-aCnc: <14 IU/mL (ref <14)

## 2021-02-25 ENCOUNTER — Other Ambulatory Visit: Payer: Self-pay

## 2021-02-25 DIAGNOSIS — E559 Vitamin D deficiency, unspecified: Secondary | ICD-10-CM

## 2021-02-25 MED ORDER — VITAMIN D (ERGOCALCIFEROL) 1.25 MG (50000 UNIT) PO CAPS: 50000.0000 [IU] | ORAL_CAPSULE | ORAL | 0 refills | Status: AC

## 2021-02-25 NOTE — Progress Notes (Signed)
Tried calling the pt- her phone just kept ringing. Will try again later.

## 2021-03-02 ENCOUNTER — Encounter: Payer: Self-pay | Admitting: Family Medicine

## 2021-03-02 DIAGNOSIS — M7120 Synovial cyst of popliteal space [Baker], unspecified knee: Secondary | ICD-10-CM

## 2021-03-03 ENCOUNTER — Ambulatory Visit: Payer: Self-pay

## 2021-03-03 ENCOUNTER — Ambulatory Visit (INDEPENDENT_AMBULATORY_CARE_PROVIDER_SITE_OTHER): Payer: Commercial Managed Care - PPO | Admitting: Orthopaedic Surgery

## 2021-03-03 ENCOUNTER — Encounter: Payer: Self-pay | Admitting: Orthopaedic Surgery

## 2021-03-03 DIAGNOSIS — R202 Paresthesia of skin: Secondary | ICD-10-CM | POA: Diagnosis not present

## 2021-03-03 DIAGNOSIS — R200 Anesthesia of skin: Secondary | ICD-10-CM | POA: Diagnosis not present

## 2021-03-03 NOTE — Progress Notes (Signed)
Office Visit Note   Patient: Danielle Burns           Date of Birth: [birth year]           MRN: [id number] Visit Date: 03/03/2021              Requested by: [address], [place], [place] [address] [place],  [place] [address] PCP: Carollee Herter, Alferd Apa, DO   Assessment & Plan: Visit Diagnoses:  1. Numbness and tingling in both hands     Plan: Impression is bilateral hand paresthesias concerning for carpal tunnel syndrome.  Due to the patient's presentation and specific aggravators, I believe this is more of a carpal tunnel syndrome rather than a cervical spine radiculopathy.  She has already tried wrist splints which do not seem to help.  We will go ahead and order nerve conduction study/EMG bilateral upper extremities to further assess for carpal tunnel syndrome.  She will follow-up with [place] once this is been completed.  Should these be negative, we may entertain further work-up of the cervical spine.  She will call with any concerns or questions in the meantime.  Follow-Up Instructions: Return for after NCS/EMG BUE.   Orders:  Orders Placed This Encounter  Procedures   XR Cervical Spine 2 or 3 views   Ambulatory referral to Physical Medicine Rehab   No orders of the defined types were placed in this encounter.     Procedures: No procedures performed   Clinical Data: No additional findings.   Subjective: Chief Complaint  Patient presents with   Right Hand - Pain   Left Hand - Pain    HPI patient is a very pleasant 50 year old right-hand-dominant female who comes on today with paresthesias to both hands in addition to achiness into both forearms.  Both equally as bad.  This is been ongoing for about a year and has progressively worsened.  The symptoms appear to be worse when driving, sleeping her hair or when she is sleeping where she often wakes up shaking her hands.  She denies any weakness to either upper extremity.  She has tried a wrist splint without  significant relief.  She does not take medication for this.  No has not previously undergone nerve conduction study.  She does note occasional neck pain and stiffness.  Review of Systems as detailed in HPI.  All other reviewed and are negative.   Objective: Vital Signs: There were no vitals taken for this visit.  Physical Exam well-developed well-nourished female in no acute distress.  Alert and oriented x3.  Ortho Exam bilateral hand exam shows negative Phalen and negative Tinel at the wrist and elbow.  No thenar atrophy.  Fingers are warm well perfused.  Cervical spine shows minimal spinous and paraspinous tenderness.  Painless range of motion of the neck.  No focal weakness.  She is neurovascular intact distally.  Specialty Comments:  No specialty comments available.  Imaging: XR Cervical Spine 2 or 3 views  Result Date: 03/03/2021 X-rays demonstrate moderate multilevel degenerative changes    PMFS History: Patient Active Problem List   Diagnosis Date Noted   Numbness and tingling in both hands 02/23/2021   Myalgia 02/23/2021   Bilateral calf pain 02/23/2021   Uterine fibroid 01/09/2020   IDA (iron deficiency anemia) 04/19/2018   Anemia 03/17/2018   Dyspepsia 54/62/7035   Chronic systolic heart failure ([place]) 07/29/2015   Fever of unknown origin 06/04/2015   Pharyngitis 07/31/2014   Viral  syndrome 07/31/2014   UTI (urinary tract infection) 07/31/2014   Otitis media 07/04/2012   Preventative health care 12/14/2010   Allergic rhinitis 12/14/2010   RESTLESS LEG SYNDROME 10/18/2010   KNEE PAIN, LEFT 10/18/2010   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   PALPITATIONS 01/02/2009   DIABETES MELLITUS, GESTATIONAL, HX OF 12/26/2007   Past Medical History:  Diagnosis Date   Chronic systolic heart failure ([place]) 07/29/2015   Gestational diabetes    Palpitations    Peripartum cardiomyopathy    Recurrent UTI     Family History  Problem Relation Age of Onset    Diabetes Mother        type II   Hypertension Mother    Heart failure Mother    Parkinsonism Father    Cancer Neg Hx        negative for breast and colon    Past Surgical History:  Procedure Laterality Date   IR ANGIOGRAM PELVIS SELECTIVE OR SUPRASELECTIVE  01/09/2020   IR ANGIOGRAM PELVIS SELECTIVE OR SUPRASELECTIVE  01/09/2020   IR ANGIOGRAM SELECTIVE EACH ADDITIONAL VESSEL  01/09/2020   IR ANGIOGRAM SELECTIVE EACH ADDITIONAL VESSEL  01/09/2020   IR EMBO TUMOR ORGAN ISCHEMIA INFARCT INC GUIDE ROADMAPPING  01/09/2020   IR RADIOLOGIST EVAL & MGMT  10/03/2019   IR RADIOLOGIST EVAL & MGMT  02/18/2020   IR US GUIDE VASC ACCESS LEFT  01/09/2020   TUBAL LIGATION     Social History   Occupational History   Occupation: Programmer, multimedia: [employer]  Tobacco Use   Smoking status: Never   Smokeless tobacco: Never  Vaping Use   Vaping Use: Never used  Substance and Sexual Activity   Alcohol use: No   Drug use: Never   Sexual activity: Yes    Birth control/protection: None

## 2021-03-04 ENCOUNTER — Telehealth: Payer: Self-pay | Admitting: Physical Medicine and Rehabilitation

## 2021-03-04 ENCOUNTER — Encounter: Payer: Self-pay | Admitting: Family

## 2021-03-04 NOTE — Telephone Encounter (Signed)
Pt returned [name] call to schedule from referral.  CB [phone]

## 2021-03-05 NOTE — Progress Notes (Deleted)
   Subjective:    I'm seeing this patient as a consultation for:  Dr. Carollee Herter. Note will be routed back to referring provider/PCP.  CC: B knee/leg pain/swelling  I, Teola Felipe, LAT, ATC, am serving as scribe for Dr. Lynne Leader.  HPI: Pt is a 50 y/o female presenting w/ c/o B lower leg and post knee swelling x .  She locates her pain to .  She was seen by her PCP on 02/23/21 for multiple c/o including B hand and feet pain and B lower leg pain/swelling after working a 12 hour shift.  Radiating pain: Swelling: yes in her B lower legs Aggravating factors: prolonged standing; Treatments tried:   Diagnostic testing:  B LE venous doppler US- 02/23/21   Past medical history, Surgical history, Family history, Social history, Allergies, and medications have been entered into the medical record, reviewed.  Review of Systems: No new headache, visual changes, nausea, vomiting, diarrhea, constipation, dizziness, abdominal pain, skin rash, fevers, chills, night sweats, weight loss, swollen lymph nodes, body aches, joint swelling, muscle aches, chest pain, shortness of breath, mood changes, visual or auditory hallucinations.   Objective:   There were no vitals filed for this visit. General: Well Developed, well nourished, and in no acute distress.  Neuro/Psych: Alert and oriented x3, extra-ocular muscles intact, able to move all 4 extremities, sensation grossly intact. Skin: Warm and dry, no rashes noted.  Respiratory: Not using accessory muscles, speaking in full sentences, trachea midline.  Cardiovascular: Pulses palpable, no extremity edema. Abdomen: Does not appear distended. MSK:   Lab and Radiology Results No results found for this or any previous visit (from the past 72 hour(s)). XR Cervical Spine 2 or 3 views  Result Date: 03/03/2021 X-rays demonstrate moderate multilevel degenerative changes   Impression and Recommendations:    Assessment and Plan: 50 y.o. female with ***.  PDMP  not reviewed this encounter. No orders of the defined types were placed in this encounter.  No orders of the defined types were placed in this encounter.   Discussed warning signs or symptoms. Please see discharge instructions. Patient expresses understanding.

## 2021-03-08 ENCOUNTER — Ambulatory Visit: Payer: Commercial Managed Care - PPO | Admitting: Family Medicine

## 2021-03-09 ENCOUNTER — Ambulatory Visit: Payer: Self-pay

## 2021-03-09 ENCOUNTER — Other Ambulatory Visit: Payer: Self-pay

## 2021-03-09 ENCOUNTER — Encounter: Payer: Self-pay | Admitting: Family Medicine

## 2021-03-09 ENCOUNTER — Ambulatory Visit (INDEPENDENT_AMBULATORY_CARE_PROVIDER_SITE_OTHER): Payer: Commercial Managed Care - PPO

## 2021-03-09 ENCOUNTER — Ambulatory Visit (INDEPENDENT_AMBULATORY_CARE_PROVIDER_SITE_OTHER): Payer: Commercial Managed Care - PPO | Admitting: Family Medicine

## 2021-03-09 VITALS — BP 122/76 | HR 78 | Ht 63.0 in | Wt 199.2 lb

## 2021-03-09 DIAGNOSIS — M25561 Pain in right knee: Secondary | ICD-10-CM

## 2021-03-09 DIAGNOSIS — G8929 Other chronic pain: Secondary | ICD-10-CM | POA: Diagnosis not present

## 2021-03-09 DIAGNOSIS — M542 Cervicalgia: Secondary | ICD-10-CM | POA: Diagnosis not present

## 2021-03-09 DIAGNOSIS — M25562 Pain in left knee: Secondary | ICD-10-CM | POA: Diagnosis not present

## 2021-03-09 NOTE — Progress Notes (Signed)
Subjective:    CC: B knee and leg pain  I, Danielle Burns, LAT, ATC, am serving as scribe for Dr. Lynne Leader.  HPI: Pt is a 50 y/o female presenting w/ c/o B leg pain and swelling.  She was seen by her PCP on 02/23/21 for these c/o combined w/ c/o B hand pain and was referred for a B venous doppler US that was negative for DVT but did show likely B Baker's cysts .  She locates her pain to her B ant knees.  She does not have much pain at the posterior knee.  She does not have much knee motion difficulty.  She does note neck pain and pain in the upper shoulders.  She had x-rays at [facility] recently to evaluate bilateral upper extremity paresthesias thought to be related to carpal tunnel syndrome.  Swelling: yes Aggravating factors: prolonged standing; first steps in the morning Treatments tried: Nothing  Diagnostic imaging: B venous doppler US- 02/23/21  Pertinent review of Systems: No fevers or chills  Relevant historical information: Works as a Equities trader.   Objective:    Vitals:   03/09/21 1410  BP: 122/76  Pulse: 78  SpO2: 98%   General: Well Developed, well nourished, and in no acute distress.   MSK: C-spine normal.  Normal cervical motion.  Knees bilaterally mild effusion normal-appearing normal motion nontender posterior knee stable humerus exam intact strength.  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  No acute fractures.  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  No acute fractures.  Await formal radiology review  Procedure: Real-time Ultrasound Guided Injection of right knee superior lateral patellar space Device: Philips Affiniti 50G Images permanently stored and available for review in PACS Ultrasound evaluation prior to  injection reveals moderate joint effusion. Narrowed medial joint line.  Moderate Baker's cyst. Verbal informed consent obtained.  Discussed risks and benefits of procedure. Warned about infection bleeding damage to structures skin hypopigmentation and fat atrophy among others. Patient expresses understanding and agreement Time-out conducted.   Noted no overlying erythema, induration, or other signs of local infection.   Skin prepped in a sterile fashion.   Local anesthesia: Topical Ethyl chloride.   With sterile technique and under real time ultrasound guidance: 40 mg of Kenalog and 2 mL of Marcaine injected into knee joint. Fluid seen entering the joint capsule.   Completed without difficulty   Pain immediately resolved suggesting accurate placement of the medication.   Advised to call if fevers/chills, erythema, induration, drainage, or persistent bleeding.   Images permanently stored and available for review in the ultrasound unit.  Impression: Technically successful ultrasound guided injection.   Procedure: Real-time Ultrasound Guided Injection of left knee superior lateral patellar space Device: Philips Affiniti 50G Images permanently stored and available for review in PACS Ultrasound evaluation prior to injection reveals moderate joint effusion.  Narrowed medial joint line.  Moderate Baker's cyst. Verbal informed consent obtained.  Discussed risks and benefits of procedure. Warned about infection bleeding damage to structures skin hypopigmentation and fat atrophy among others. Patient expresses understanding and agreement Time-out conducted.   Noted no overlying erythema, induration, or other signs of local infection.   Skin prepped in a sterile fashion.   Local anesthesia: Topical Ethyl chloride.   With sterile technique and under real time ultrasound  guidance: 40 mg of Kenalog and 2 mL of Marcaine injected into knee joint. Fluid seen entering the joint capsule.   Completed without  difficulty   Pain immediately resolved suggesting accurate placement of the medication.   Advised to call if fevers/chills, erythema, induration, drainage, or persistent bleeding.   Images permanently stored and available for review in the ultrasound unit.  Impression: Technically successful ultrasound guided injection.        Impression and Recommendations:    Assessment and Plan: 50 y.o. female with bilateral knee pain.  Pain thought to be primarily related to DJD.  Although she does have Baker's cyst they do not seem to be bothering her all that much.  Plan for bilateral steroid injection knees today.  Also recommend Voltaren gel.  Physical therapy should be helpful as quad strengthening may likely help her knee pain as well.  Additionally she has neck pain.  This is also very likely related to DDD seen on recent cervical spine x-ray.  Additionally muscle spasm and dysfunction is also could be contributory.  Physical therapy should also help this as well.  Plan for physical therapy order.  Recheck in 6 weeks.  PDMP not reviewed this encounter. Orders Placed This Encounter  Procedures   [place] LIMITED JOINT SPACE STRUCTURES LOW BILAT(NO LINKED CHARGES)    Order Specific Question:   Reason for Exam (SYMPTOM  OR DIAGNOSIS REQUIRED)    Answer:   B knee pain    Order Specific Question:   Preferred imaging location?    Answer:   [facility]   DG Knee AP/LAT W/Sunrise Left    Standing Status:   Future    Number of Occurrences:   1    Standing Expiration Date:   04/08/2021    Order Specific Question:   Reason for Exam (SYMPTOM  OR DIAGNOSIS REQUIRED)    Answer:   L knee pain    Order Specific Question:   Is patient pregnant?    Answer:   No    Order Specific Question:   Preferred imaging location?    Answer:   [name]   DG Knee AP/LAT W/Sunrise Right    Standing Status:   Future    Number of Occurrences:   1    Standing Expiration Date:    04/08/2021    Order Specific Question:   Reason for Exam (SYMPTOM  OR DIAGNOSIS REQUIRED)    Answer:   R knee pain    Order Specific Question:   Is patient pregnant?    Answer:   No    Order Specific Question:   Preferred imaging location?    Answer:   [name]   Ambulatory referral to Physical Therapy    Referral Priority:   Routine    Referral Type:   Physical Medicine    Referral Reason:   Specialty Services Required    Requested Specialty:   Physical Therapy    Number of Visits Requested:   1   No orders of the defined types were placed in this encounter.   Discussed warning signs or symptoms. Please see discharge instructions. Patient expresses understanding.   The above documentation has been reviewed and is accurate and complete Lynne Leader, M.D.

## 2021-03-09 NOTE — Patient Instructions (Signed)
Thank you for coming in today.   Plan for PT for the neck.   Please use Voltaren gel (Generic Diclofenac Gel) up to 4x daily for pain as needed.  This is available over-the-counter as both the name brand Voltaren gel and the generic diclofenac gel.   Call or go to the ER if you develop a large red swollen joint with extreme pain or oozing puss.    I recommend you obtained a compression sleeve to help with your joint problems. There are many options on the market however I recommend obtaining a full knee Body Helix compression sleeve.  You can find information (including how to appropriate measure yourself for sizing) can be found at www.Body http://www.lambert.com/.  Many of these products are health savings account (HSA) eligible.   You can use the compression sleeve at any time throughout the day but is most important to use while being active as well as for 2 hours post-activity.   It is appropriate to ice following activity with the compression sleeve in place.

## 2021-03-11 NOTE — Progress Notes (Signed)
Left knee x-ray shows more severe arthritis in the medial compartment of the knee.

## 2021-03-11 NOTE — Progress Notes (Signed)
Right knee x-ray shows more severe arthritis in the medial compartment of the knee.

## 2021-03-24 ENCOUNTER — Ambulatory Visit: Payer: Commercial Managed Care - PPO | Attending: Family Medicine | Admitting: Physical Therapy

## 2021-04-20 ENCOUNTER — Ambulatory Visit: Payer: Commercial Managed Care - PPO | Admitting: Family Medicine

## 2021-04-20 NOTE — Progress Notes (Deleted)
   I, Peterson Lombard, LAT, ATC acting as a scribe for Lynne Leader, MD.  Danielle Burns is a 50 y.o. female who presents to [facility] [place] [facility] today for f/u bilat knee pain. Pt was last seen by Dr. Georgina Snell on 03/09/21 and was given bilat knee steroid injections and advised to use Voltaren gel and was referred to PT, but had a no-show appointment on 03/24/21. Today, pt reports   Dx testing: 03/09/21 R & L knee XR 02/23/21 Bilat venous doppler US  Pertinent review of systems: ***  Relevant historical information: ***   Exam:  There were no vitals taken for this visit. General: Well Developed, well nourished, and in no acute distress.   MSK: ***    Lab and Radiology Results No results found for this or any previous visit (from the past 72 hour(s)). No results found.     Assessment and Plan: 50 y.o. female with ***   PDMP not reviewed this encounter. No orders of the defined types were placed in this encounter.  No orders of the defined types were placed in this encounter.    Discussed warning signs or symptoms. Please see discharge instructions. Patient expresses understanding.   ***

## 2021-04-22 IMAGING — MG DIGITAL DIAGNOSTIC BILAT W/ TOMO W/ CAD
8 series · 8 of 24 positions shown · non-contrast
Comparison: Previous exam(s).

CLINICAL DATA: Screening recall for possible masses in each breast.

EXAM:
DIGITAL DIAGNOSTIC BILATERAL MAMMOGRAM WITH CAD AND TOMO
ULTRASOUND BILATERAL BREAST

[L MLO synth-2D]
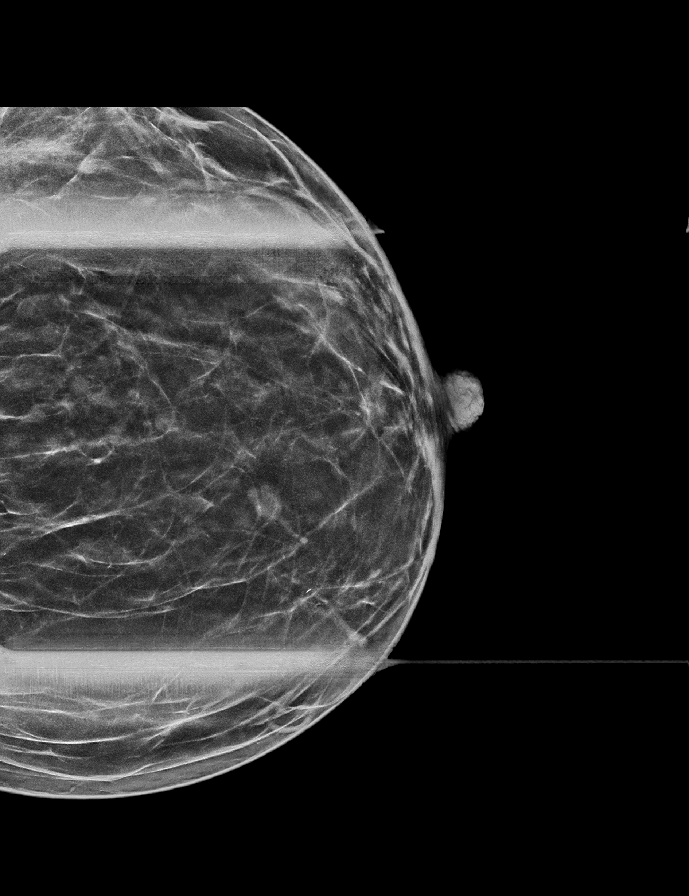

[R CC synth-2D]
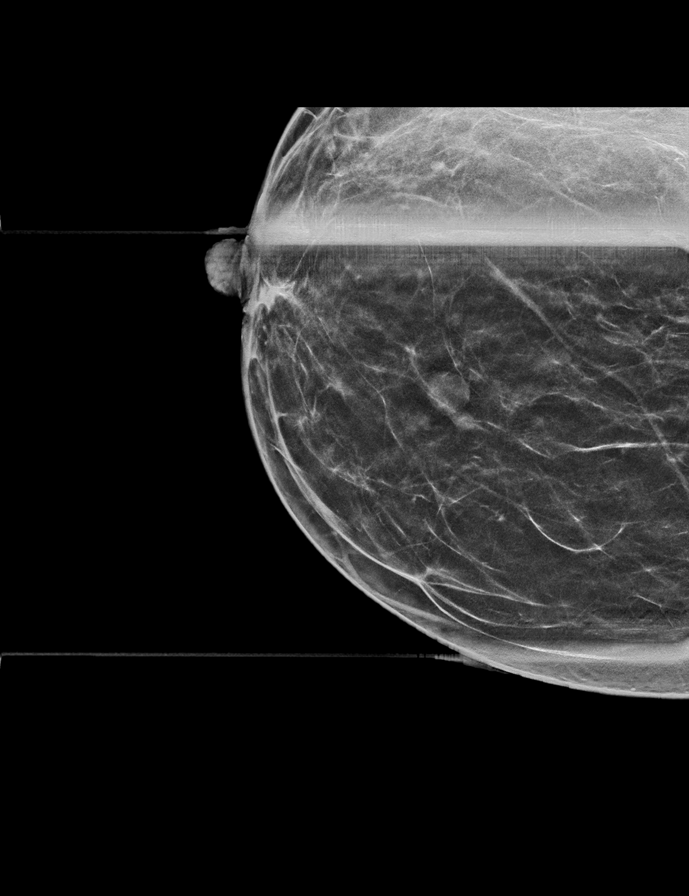

[R MLO synth-2D]
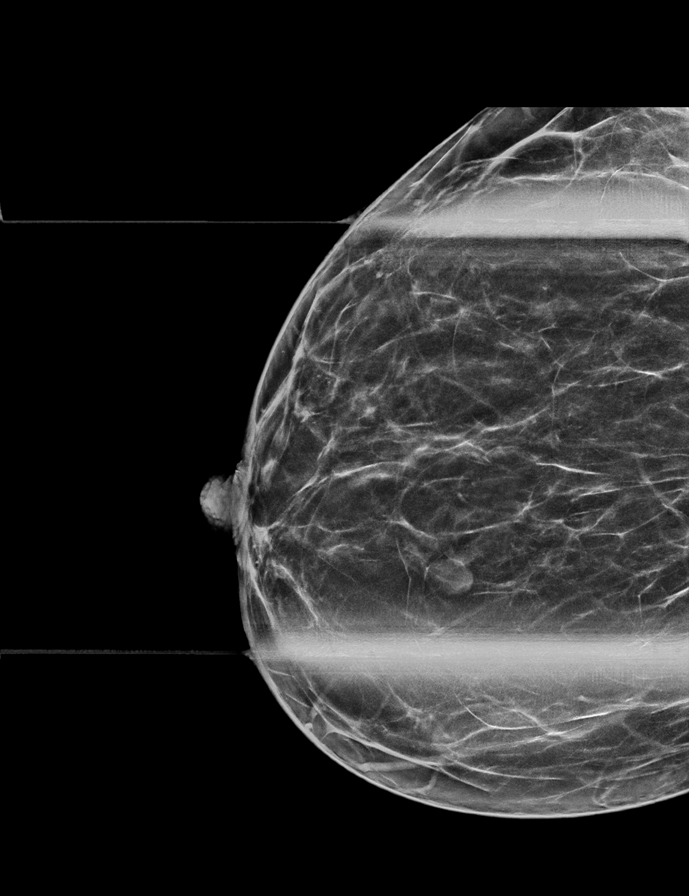

[L CC synth-2D]
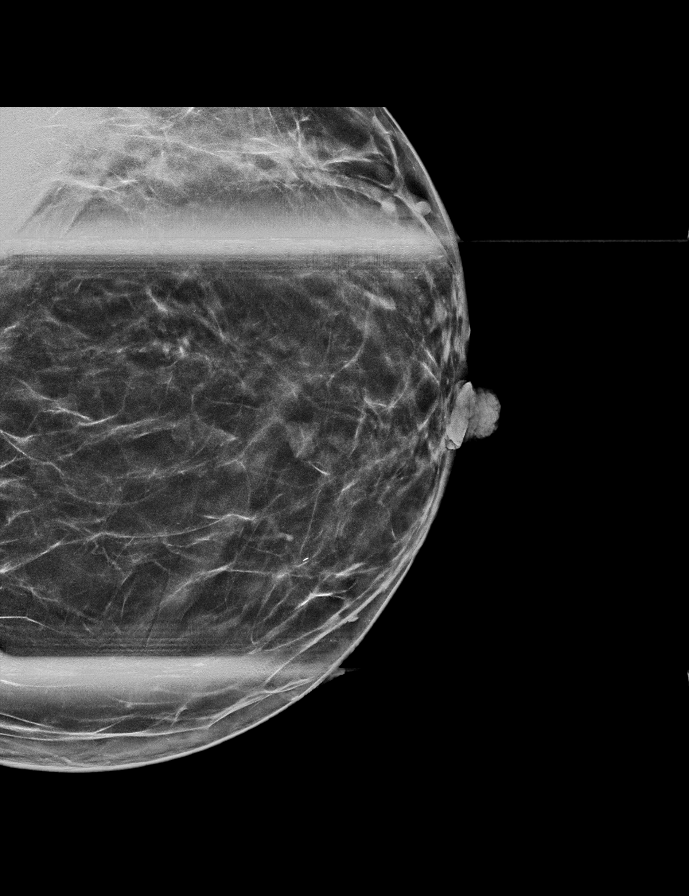

[L CC tomo · tomo slice 29/57.0]
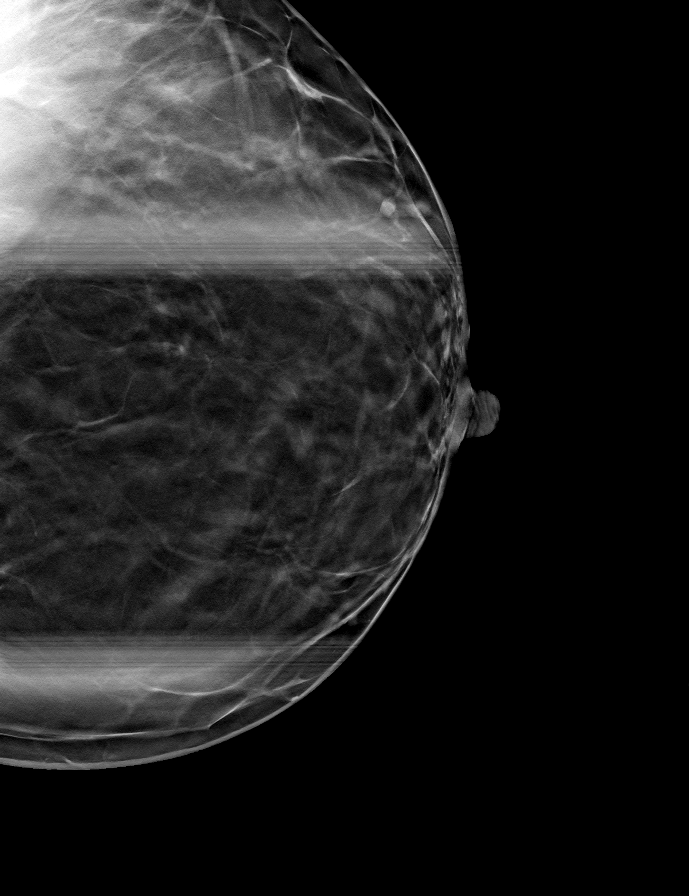

[R CC tomo · tomo slice 25/49.0]
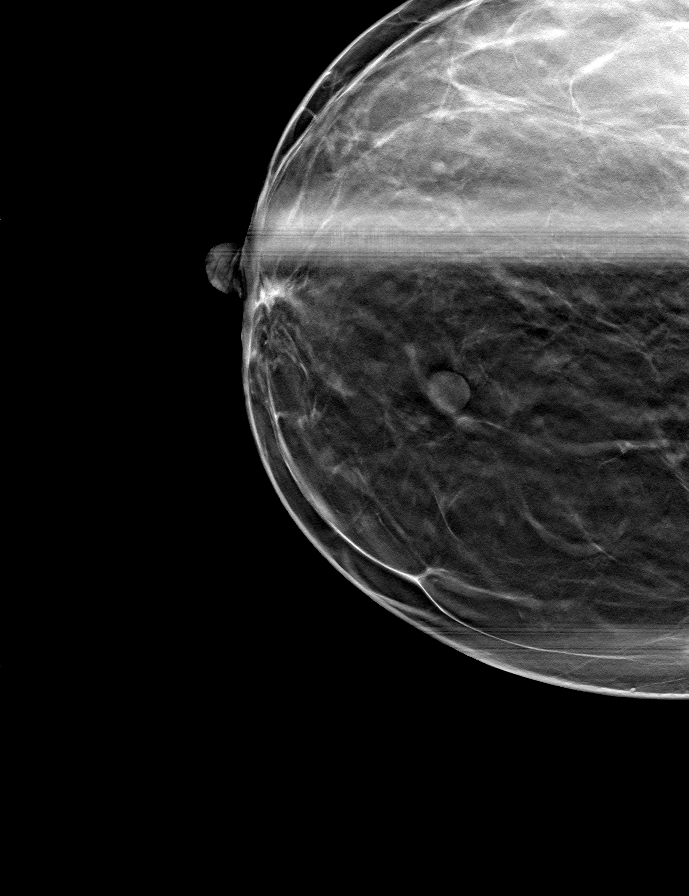

[R MLO tomo · tomo slice 30/59.0]
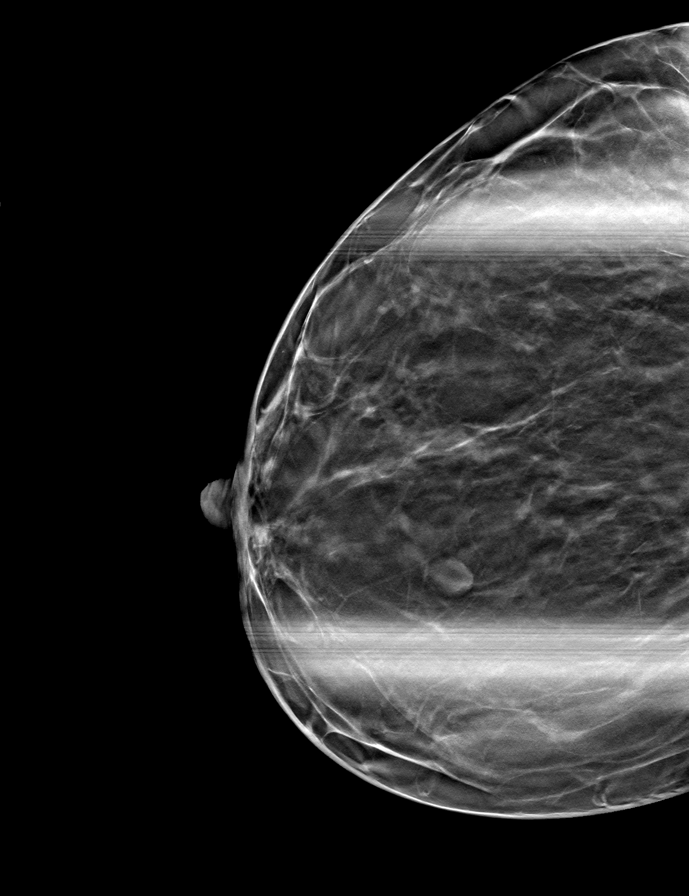

[L MLO tomo · tomo slice 29/57.0]
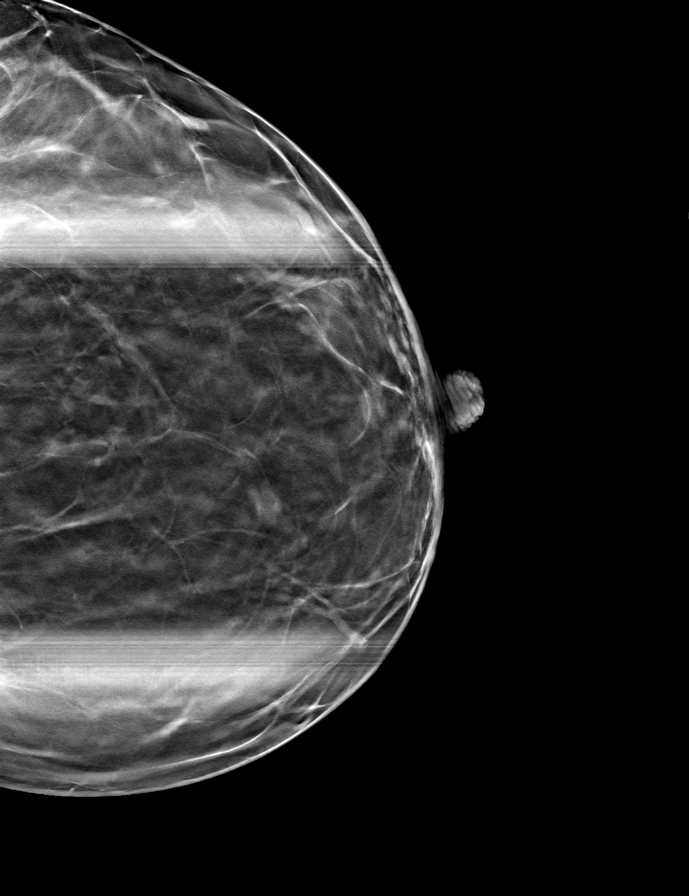

[8 of 24 positions shown; findings below may reference images not displayed]

ACR Breast Density Category b: There are scattered areas of
fibroglandular density.
FINDINGS: The possible masses noted in each breast persist on spot compression
imaging. The right, this is a circumscribed, oval approximately 10
mm mass in the medial breast. The left, this is lobulated,
circumscribed, 8-9 mm mass the anterior, medial breast.

Mammographic images were processed with CAD.

Targeted right breast ultrasound is performed, showing an oval
simple cyst in the right breast at 1 o'clock, 3 cm the nipple,
measuring 9 x 7 x 8 mm, consistent in size, shape and location to
the mammographic mass.

Targeted left breast ultrasound is performed, showing a lobulated
cyst at 10 o'clock, 3 cm the nipple, anterior depth, measuring 9 x 4
x 7 mm, consistent in size, shape and location to the mammographic
mass.
IMPRESSION: 1. No evidence of breast malignancy.
2. Benign bilateral breast cysts.

RECOMMENDATION:
Screening mammogram in one year.(Code:DN-K-RX5)

I have discussed the findings and recommendations with the patient.
If applicable, a reminder letter will be sent to the patient
regarding the next appointment.

BI-RADS CATEGORY  2: Benign.

## 2021-04-22 IMAGING — US US BREAST*R* LIMITED INC AXILLA
1 series · 5 of 5 positions shown · non-contrast
Comparison: Previous exam(s).

CLINICAL DATA: Screening recall for possible masses in each breast.

EXAM:
DIGITAL DIAGNOSTIC BILATERAL MAMMOGRAM WITH CAD AND TOMO
ULTRASOUND BILATERAL BREAST

[Series 1: us breast*right* limited inc axilla · 0.07mm/px · 5 of 5 slices shown]
[im 1/5]
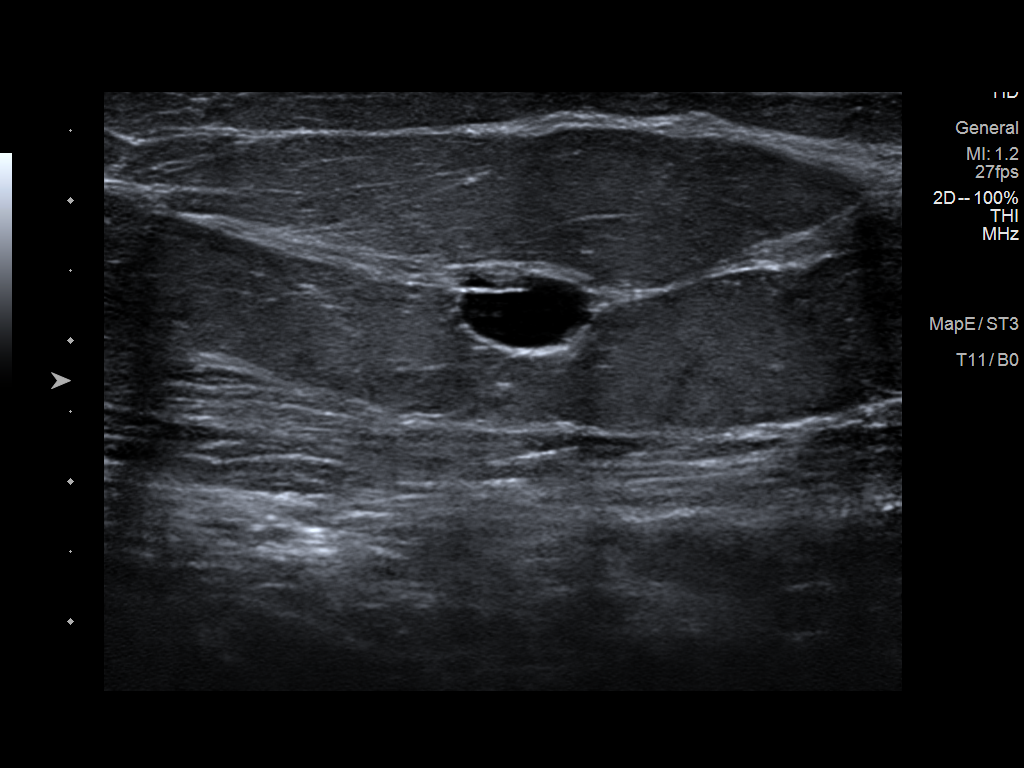
[im 2/5]
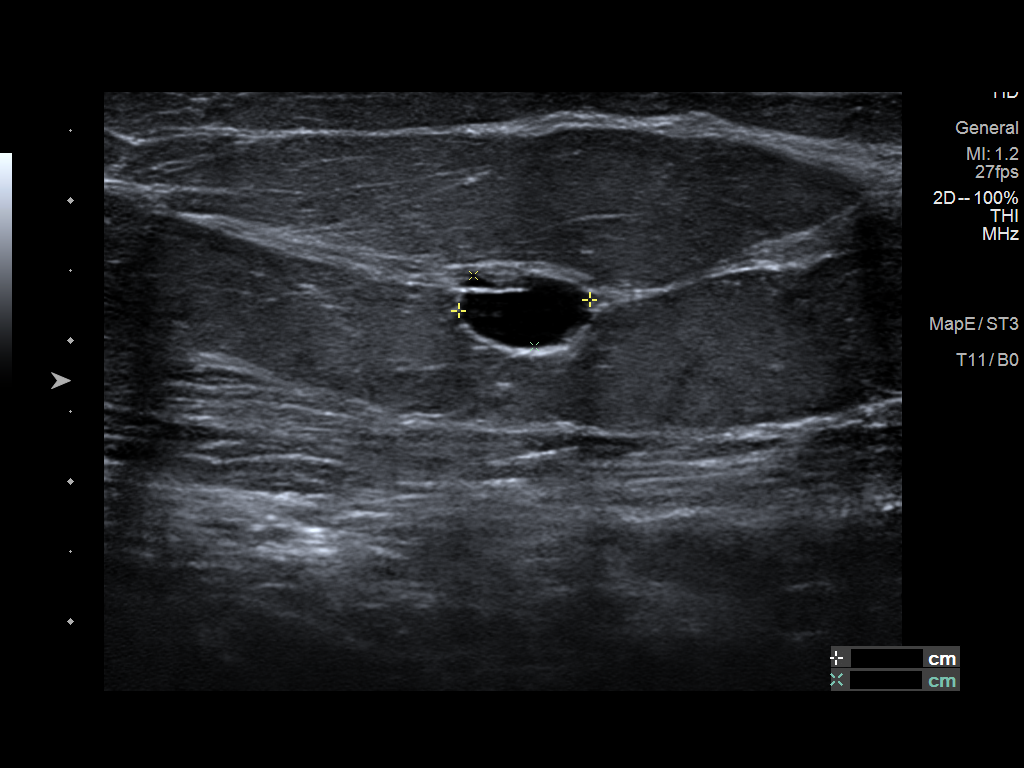
[im 3/5]
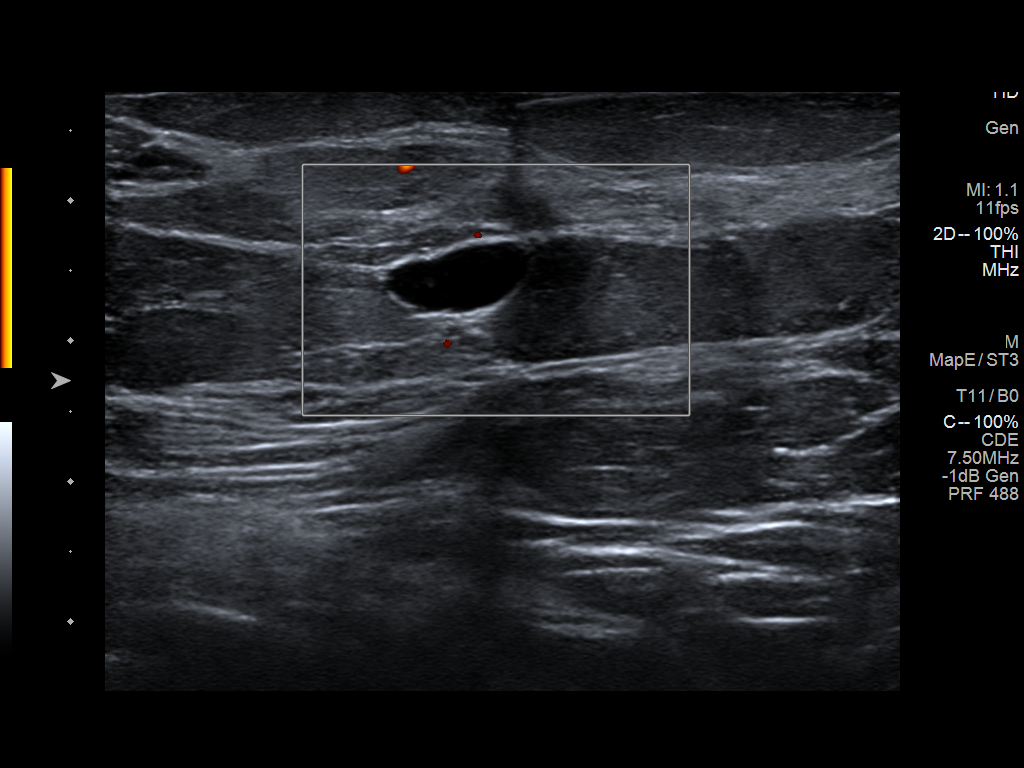
[im 4/5]
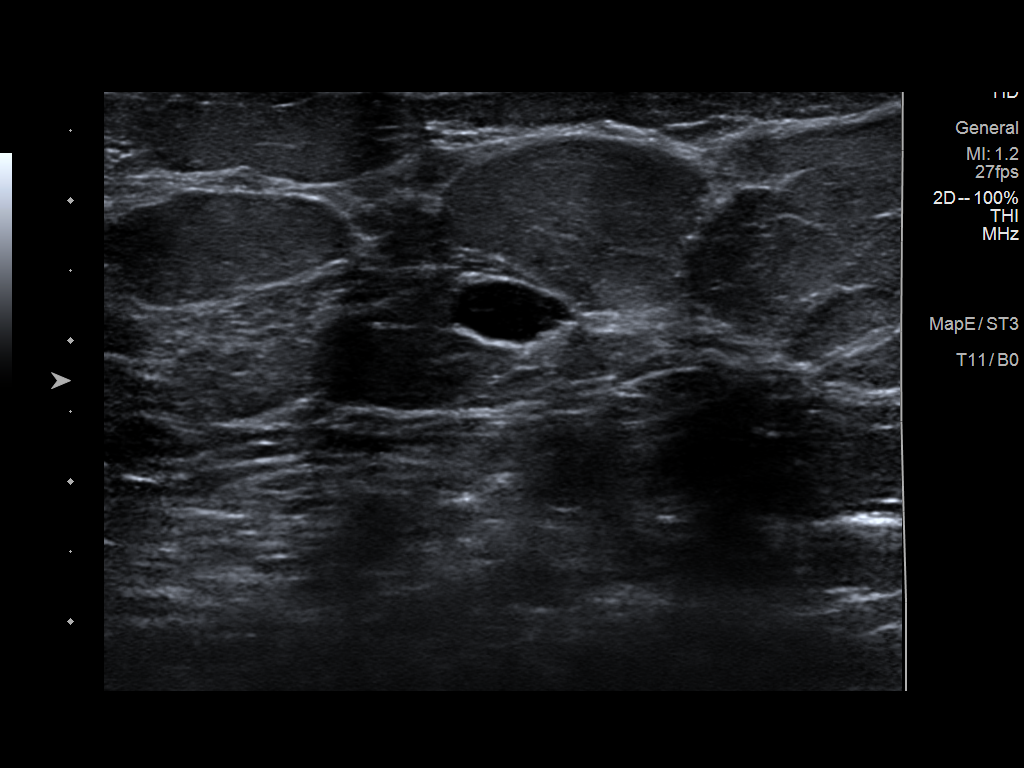
[im 5/5]
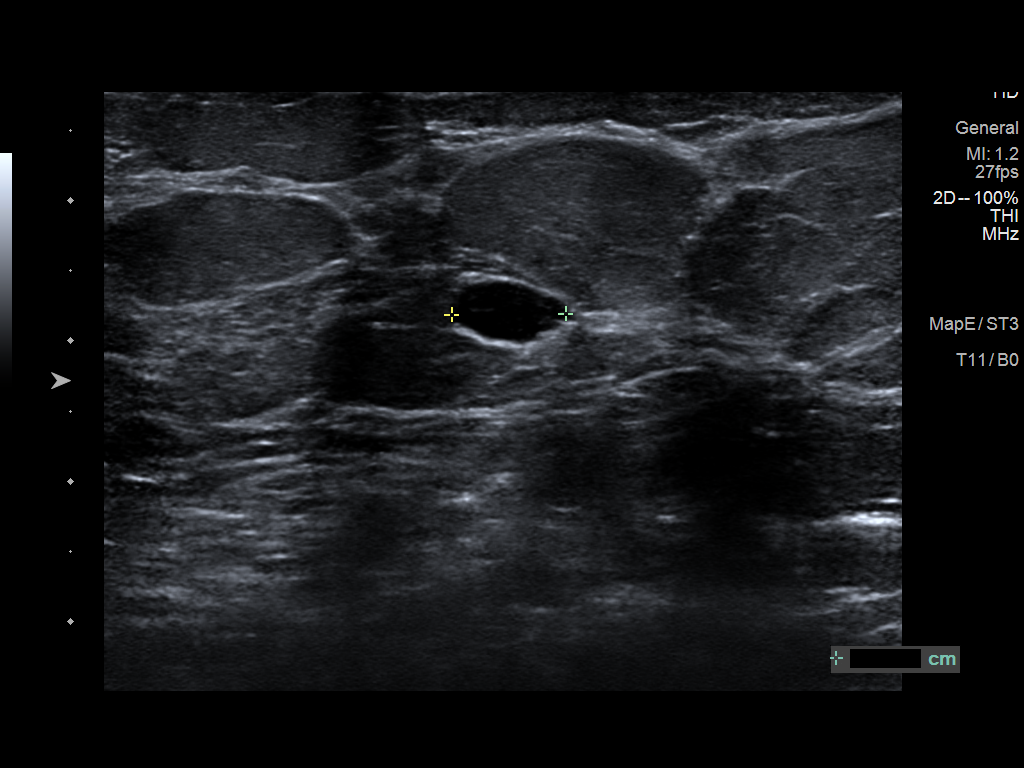

[5 of 5 positions shown; findings below may reference images not displayed]

ACR Breast Density Category b: There are scattered areas of
fibroglandular density.
FINDINGS: The possible masses noted in each breast persist on spot compression
imaging. The right, this is a circumscribed, oval approximately 10
mm mass in the medial breast. The left, this is lobulated,
circumscribed, 8-9 mm mass the anterior, medial breast.

Mammographic images were processed with CAD.

Targeted right breast ultrasound is performed, showing an oval
simple cyst in the right breast at 1 o'clock, 3 cm the nipple,
measuring 9 x 7 x 8 mm, consistent in size, shape and location to
the mammographic mass.

Targeted left breast ultrasound is performed, showing a lobulated
cyst at 10 o'clock, 3 cm the nipple, anterior depth, measuring 9 x 4
x 7 mm, consistent in size, shape and location to the mammographic
mass.
IMPRESSION: 1. No evidence of breast malignancy.
2. Benign bilateral breast cysts.

RECOMMENDATION:
Screening mammogram in one year.(Code:DN-K-RX5)

I have discussed the findings and recommendations with the patient.
If applicable, a reminder letter will be sent to the patient
regarding the next appointment.

BI-RADS CATEGORY  2: Benign.

## 2021-04-30 ENCOUNTER — Encounter: Payer: Commercial Managed Care - PPO | Admitting: Physical Medicine and Rehabilitation

## 2021-06-09 ENCOUNTER — Encounter: Payer: Commercial Managed Care - PPO | Admitting: Physical Medicine and Rehabilitation

## 2023-12-25 ENCOUNTER — Encounter: Payer: Self-pay | Admitting: Family

## 2023-12-26 ENCOUNTER — Encounter: Payer: Self-pay | Admitting: Family

## 2023-12-26 ENCOUNTER — Ambulatory Visit: Admitting: Family Medicine

## 2023-12-26 ENCOUNTER — Other Ambulatory Visit: Payer: Self-pay | Admitting: Family Medicine

## 2023-12-26 ENCOUNTER — Encounter: Payer: Self-pay | Admitting: Family Medicine

## 2023-12-26 VITALS — BP 130/90 | HR 83 | Temp 98.9°F | Resp 18 | Ht 63.0 in | Wt 181.2 lb

## 2023-12-26 DIAGNOSIS — J0140 Acute pansinusitis, unspecified: Secondary | ICD-10-CM

## 2023-12-26 DIAGNOSIS — R051 Acute cough: Secondary | ICD-10-CM

## 2023-12-26 MED ORDER — PREDNISONE 10 MG PO TABS: ORAL_TABLET | ORAL | 0 refills | Status: DC

## 2023-12-26 MED ORDER — FLUTICASONE PROPIONATE 50 MCG/ACT NA SUSP: 1.0000 | Freq: Every day | NASAL | 5 refills | Status: AC | PRN

## 2023-12-26 MED ORDER — AMOXICILLIN-POT CLAVULANATE 875-125 MG PO TABS: 1.0000 | ORAL_TABLET | Freq: Two times a day (BID) | ORAL | 0 refills | Status: DC

## 2023-12-26 MED ORDER — PROMETHAZINE-DM 6.25-15 MG/5ML PO SYRP: 5.0000 mL | ORAL_SOLUTION | Freq: Four times a day (QID) | ORAL | 0 refills | Status: DC | PRN

## 2023-12-26 NOTE — Patient Instructions (Signed)

## 2023-12-26 NOTE — Progress Notes (Unsigned)
 Established Patient Office Visit  Subjective   Patient ID: Danielle Burns, female    DOB: [birth year]  Age: 53 y.o. MRN: [id number]  Chief Complaint  Patient presents with   Cough    Pt states sxs started over a week ago, productive, pt states travelling to [place] and came back Sunday. No covid test.    Abdominal Pain            HPI Discussed the use of AI scribe software for clinical note transcription with the patient, who gave verbal consent to proceed.  History of Present Illness Danielle Burns is a 53 year old female who presents with a persistent cough and recent travel history. She is accompanied by her husband, who also experienced similar symptoms.  She has a persistent cough that began approximately one week ago during a religious trip to [place]. The cough is productive, with 'a lot of brownish, thick, jellifer' sputum, and is particularly bothersome at night, affecting her sleep.  She experienced a fever of 101F at the onset of symptoms, accompanied by body aches that confined her to bed for two days. The fever began last Wednesday, and her symptoms have slightly improved since returning home on Sunday, though the cough remains.  Her husband also developed similar symptoms and was diagnosed with acute pharyngitis after a medical consultation. Both were tested for COVID-19, and the results were negative.  She reports a burning sensation in her chest and occasional throat pain, which was more severe earlier. Initially, she experienced sinus pressure, which has since resolved.  She has been using Flonase regularly but has not taken Zyrtec. During her trip, she used an unspecified cough medication purchased there, which is now finished. Recently, she tried ginger as a home remedy for her symptoms.   Patient Active Problem List   Diagnosis Date Noted   Numbness and tingling in both hands 02/23/2021   Myalgia 02/23/2021   Bilateral calf pain 02/23/2021   Uterine  fibroid 01/09/2020   IDA (iron deficiency anemia) 04/19/2018   Anemia 03/17/2018   Dyspepsia 03/17/2018   Chronic systolic heart failure (HCC) 07/29/2015   Fever of unknown origin 06/04/2015   Pharyngitis 07/31/2014   Viral syndrome 07/31/2014   UTI (urinary tract infection) 07/31/2014   Otitis media 07/04/2012   Preventative health care 12/14/2010   Allergic rhinitis 12/14/2010   RESTLESS LEG SYNDROME 10/18/2010   KNEE PAIN, LEFT 10/18/2010   Peripartum cardiomyopathy, 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   PALPITATIONS 01/02/2009   DIABETES MELLITUS, GESTATIONAL, HX OF 12/26/2007   Past Medical History:  Diagnosis Date   Chronic systolic heart failure (HCC) 07/29/2015   Gestational diabetes    Palpitations    Peripartum cardiomyopathy    Recurrent UTI    Past Surgical History:  Procedure Laterality Date   IR ANGIOGRAM PELVIS SELECTIVE OR SUPRASELECTIVE  01/09/2020   IR ANGIOGRAM PELVIS SELECTIVE OR SUPRASELECTIVE  01/09/2020   IR ANGIOGRAM SELECTIVE EACH ADDITIONAL VESSEL  01/09/2020   IR ANGIOGRAM SELECTIVE EACH ADDITIONAL VESSEL  01/09/2020   IR EMBO TUMOR ORGAN ISCHEMIA INFARCT INC GUIDE ROADMAPPING  01/09/2020   IR RADIOLOGIST EVAL & MGMT  10/03/2019   IR RADIOLOGIST EVAL & MGMT  02/18/2020   IR US GUIDE VASC ACCESS LEFT  01/09/2020   TUBAL LIGATION     Social History   Tobacco Use   Smoking status: Never   Smokeless tobacco: Never  Vaping Use   Vaping status: Never Used  Substance Use Topics   Alcohol  use: No   Drug use: Never   Social History   Socioeconomic History   Marital status: Married    Spouse name: Not on file   Number of children: 4   Years of education: Not on file   Highest education level: Not on file  Occupational History   Occupation: RN    Employer: [employer]  Tobacco Use   Smoking status: Never   Smokeless tobacco: Never  Vaping Use   Vaping status: Never Used  Substance and Sexual Activity   Alcohol use: No   Drug use: Never   Sexual  activity: Yes    Birth control/protection: None  Other Topics Concern   Not on file  Social History Narrative   Originally from [place].   Accompanied by son - Sulamon 3 yrs   2 sons and 2 daughters   Social Drivers of Corporate investment banker Strain: Not on file  Food Insecurity: Not on file  Transportation Needs: Not on file  Physical Activity: Not on file  Stress: Not on file  Social Connections: Not on file  Intimate Partner Violence: Not on file   Family Status  Relation Name Status   Mother  Alive   Sister 4 Alive   Brother 2 Alive   Father  (Not Specified)   Neg Hx  (Not Specified)  No partnership data on file   Family History  Problem Relation Age of Onset   Diabetes Mother        type II   Hypertension Mother    Heart failure Mother    Parkinsonism Father    Cancer Neg Hx        negative for breast and colon   No Known Allergies    Review of Systems  Constitutional:  Positive for fever. Negative for malaise/fatigue.  HENT:  Positive for congestion and sinus pain.   Eyes:  Negative for blurred vision.  Respiratory:  Positive for cough and sputum production. Negative for shortness of breath.   Cardiovascular:  Negative for chest pain, palpitations and leg swelling.  Gastrointestinal:  Negative for abdominal pain, blood in stool and nausea.  Genitourinary:  Negative for dysuria and frequency.  Musculoskeletal:  Positive for myalgias. Negative for falls.  Skin:  Negative for rash.  Neurological:  Negative for dizziness, loss of consciousness and headaches.  Endo/Heme/Allergies:  Negative for environmental allergies.  Psychiatric/Behavioral:  Negative for depression. The patient is not nervous/anxious.       Objective:     BP (!) 130/90 (BP Location: Right Arm, Patient Position: Sitting, Cuff Size: Normal)   Pulse 83   Temp 98.9 F (37.2 C) (Oral)   Resp 18   Ht 5\' 3"  (1.6 m)   Wt 181 lb 3.2 oz (82.2 kg)   SpO2 96%   BMI 32.10 kg/m  BP  Readings from Last 3 Encounters:  12/26/23 (!) 130/90  03/09/21 122/76  02/23/21 120/80   Wt Readings from Last 3 Encounters:  12/26/23 181 lb 3.2 oz (82.2 kg)  03/09/21 199 lb 3.2 oz (90.4 kg)  02/23/21 201 lb 3.2 oz (91.3 kg)   SpO2 Readings from Last 3 Encounters:  12/26/23 96%  03/09/21 98%  02/23/21 97%      Physical Exam Vitals and nursing note reviewed.  Constitutional:      General: She is not in acute distress.    Appearance: Normal appearance. She is well-developed.  HENT:     Head: Normocephalic and atraumatic.  Right Ear: Hearing and tympanic membrane normal.     Left Ear: Hearing and tympanic membrane normal.     Nose:     Right Sinus: Maxillary sinus tenderness and frontal sinus tenderness present.     Left Sinus: Maxillary sinus tenderness and frontal sinus tenderness present.     Mouth/Throat:     Pharynx: Posterior oropharyngeal erythema present. No oropharyngeal exudate.  Eyes:     General: No scleral icterus.       Right eye: No discharge.        Left eye: No discharge.  Cardiovascular:     Rate and Rhythm: Normal rate and regular rhythm.     Heart sounds: No murmur heard. Pulmonary:     Effort: Pulmonary effort is normal. No respiratory distress.     Breath sounds: Normal breath sounds.  Musculoskeletal:        General: Normal range of motion.     Cervical back: Normal range of motion and neck supple.     Right lower leg: No edema.     Left lower leg: No edema.  Skin:    General: Skin is warm and dry.  Neurological:     Mental Status: She is alert and oriented to person, place, and time.  Psychiatric:        Mood and Affect: Mood normal.        Behavior: Behavior normal.        Thought Content: Thought content normal.        Judgment: Judgment normal.      No results found for any visits on 12/26/23.  Last CBC Lab Results  Component Value Date   WBC 5.6 02/23/2021   HGB 12.0 02/23/2021   HCT 36.1 02/23/2021   MCV 82.1  02/23/2021   MCH 27.3 01/09/2020   RDW 14.3 02/23/2021   PLT 310.0 02/23/2021   Last metabolic panel Lab Results  Component Value Date   GLUCOSE 102 (H) 01/09/2020   NA 138 01/09/2020   K 3.9 01/09/2020   CL 109 01/09/2020   CO2 23 01/09/2020   BUN 9 01/09/2020   CREATININE 0.75 01/09/2020   GFRNONAA >60 01/09/2020   CALCIUM 8.7 (L) 01/09/2020   PROT 8.1 04/19/2018   ALBUMIN 3.6 04/19/2018   BILITOT 0.3 04/19/2018   ALKPHOS 72 04/19/2018   AST 18 04/19/2018   ALT 14 04/19/2018   ANIONGAP 6 01/09/2020   Last lipids Lab Results  Component Value Date   CHOL 172 06/04/2015   HDL 40.50 06/04/2015   LDLCALC 112 (H) 06/04/2015   TRIG 96.0 06/04/2015   CHOLHDL 4 06/04/2015   Last hemoglobin A1c Lab Results  Component Value Date   HGBA1C 6.1 02/23/2021   Last thyroid functions Lab Results  Component Value Date   TSH 0.44 02/23/2021   Last vitamin D Lab Results  Component Value Date   VD25OH 23.13 (L) 02/23/2021   Last vitamin B12 and Folate Lab Results  Component Value Date   VITAMINB12 1,056 (H) 02/23/2021   FOLATE 12.1 08/07/2014      The ASCVD Risk score (Arnett DK, et al., 2019) failed to calculate for the following reasons:   Cannot find a previous HDL lab   Cannot find a previous total cholesterol lab    Assessment & Plan:   Problem List Items Addressed This Visit   None Visit Diagnoses       Acute non-recurrent pansinusitis    -  Primary   Relevant Medications  promethazine-dextromethorphan (PROMETHAZINE-DM) 6.25-15 MG/5ML syrup   amoxicillin-clavulanate (AUGMENTIN) 875-125 MG tablet   fluticasone (FLONASE) 50 MCG/ACT nasal spray   predniSONE (DELTASONE) 10 MG tablet     Acute cough       Relevant Medications   promethazine-dextromethorphan (PROMETHAZINE-DM) 6.25-15 MG/5ML syrup   amoxicillin-clavulanate (AUGMENTIN) 875-125 MG tablet   predniSONE (DELTASONE) 10 MG tablet   Other Relevant Orders   POC COVID-19     Assessment and  Plan Assessment & Plan Acute Bronchitis   She presents with a persistent cough producing brownish, thick sputum, which began one week ago during a trip to [place]. Initially, she had a fever of 101F and body aches, indicating an acute respiratory infection. Her COVID-19 test is negative. Her husband has acute pharyngitis. She reports a burning sensation in the chest and difficulty sleeping due to the cough. Symptoms have slightly improved since returning home, but the cough persists. Prescribe prednisone to reduce inflammation and help loosen the cough. Prescribe an antibiotic for any potential bacterial infection. Prescribe cough syrup to manage the cough, especially at night. Advise using Flonase for nasal congestion. Instruct her to contact the clinic if symptoms do not improve or worsen within four to five days.  Hypertension   Her blood pressure is elevated. No current antihypertensive medication use or management plan discussed.  General Health Maintenance   She inquired about scheduling a physical examination, indicating a need for routine follow-up care. Schedule a physical examination.    Return if symptoms worsen or fail to improve.    Donato Schultz, DO

## 2024-01-01 LAB — POC COVID19 BINAXNOW: SARS Coronavirus 2 Ag: NEGATIVE

## 2024-01-12 ENCOUNTER — Encounter: Admitting: Family Medicine

## 2024-01-22 ENCOUNTER — Encounter: Admitting: Family Medicine

## 2024-02-08 ENCOUNTER — Ambulatory Visit (INDEPENDENT_AMBULATORY_CARE_PROVIDER_SITE_OTHER): Admitting: Family Medicine

## 2024-02-08 ENCOUNTER — Ambulatory Visit (HOSPITAL_BASED_OUTPATIENT_CLINIC_OR_DEPARTMENT_OTHER)
Admission: RE | Admit: 2024-02-08 | Discharge: 2024-02-08 | Disposition: A | Discharge status: Home / Self Care | Source: Ambulatory Visit | Attending: Family Medicine | Admitting: Family Medicine

## 2024-02-08 ENCOUNTER — Encounter: Payer: Self-pay | Admitting: Family Medicine

## 2024-02-08 VITALS — BP 136/90 | HR 64 | Temp 98.3°F | Resp 18 | Ht 63.0 in | Wt 180.2 lb

## 2024-02-08 DIAGNOSIS — Z23 Encounter for immunization: Secondary | ICD-10-CM | POA: Diagnosis not present

## 2024-02-08 DIAGNOSIS — Z0000 Encounter for general adult medical examination without abnormal findings: Secondary | ICD-10-CM | POA: Diagnosis not present

## 2024-02-08 DIAGNOSIS — D509 Iron deficiency anemia, unspecified: Secondary | ICD-10-CM

## 2024-02-08 DIAGNOSIS — K635 Polyp of colon: Secondary | ICD-10-CM

## 2024-02-08 DIAGNOSIS — M542 Cervicalgia: Secondary | ICD-10-CM | POA: Insufficient documentation

## 2024-02-08 DIAGNOSIS — G5603 Carpal tunnel syndrome, bilateral upper limbs: Secondary | ICD-10-CM

## 2024-02-08 DIAGNOSIS — M170 Bilateral primary osteoarthritis of knee: Secondary | ICD-10-CM

## 2024-02-08 MED ORDER — CYCLOBENZAPRINE HCL 10 MG PO TABS: 10.0000 mg | ORAL_TABLET | Freq: Three times a day (TID) | ORAL | 0 refills | Status: AC | PRN

## 2024-02-08 NOTE — Progress Notes (Signed)
 o  Established Patient Office Visit  Subjective   Patient ID: Danielle Burns, female    DOB: 1971/05/04  Age: 53 y.o. MRN: [id number]  Chief Complaint  Patient presents with   Annual Exam    Pt states fasting     HPI Discussed the use of AI scribe software for clinical note transcription with the patient, who gave verbal consent to proceed.  History of Present Illness Danielle Burns is a 53 year old female who presents with bilateral hand pain and burning sensation.  She experiences pain and a burning sensation in both hands, affecting all fingers. Initially, the symptoms occurred during sleep but have progressed to being present intermittently throughout the day. The pain is described as intense and sometimes occurs without any specific activity, although it worsens during driving and occasionally in the morning. She has previously used splints at night, but they did not provide relief. The symptoms have been ongoing for approximately two years.  She also experiences neck stiffness and pain, which sometimes radiates to her shoulders and hips. She recalls having a previous x-ray of her neck and was told she may have bone spurs. She occasionally receives massages, which provide temporary relief for her shoulder tightness.  She has a history of bilateral knee arthritis, for which she underwent meniscal surgery and received cortisone injections in 2019. She reports persistent knee pain but has not been taking any medication for it recently.  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 She travels to [place] every two years. No recent changes in her family history and no new surgeries.      Review of Systems  Constitutional:  Negative for chills, fever and malaise/fatigue.  HENT:  Negative for congestion and hearing loss.   Eyes:  Negative for discharge.  Respiratory:  Negative for cough, sputum production and shortness of breath.    Cardiovascular:  Negative for chest pain, palpitations and leg swelling.  Gastrointestinal:  Negative for abdominal pain, blood in stool, constipation, diarrhea, heartburn, nausea and vomiting.  Genitourinary:  Negative for dysuria, frequency, hematuria and urgency.  Musculoskeletal:  Negative for back pain, falls and myalgias.  Skin:  Negative for rash.  Neurological:  Negative for dizziness, sensory change, loss of consciousness, weakness and headaches.  Endo/Heme/Allergies:  Negative for environmental allergies. Does not bruise/bleed easily.  Psychiatric/Behavioral:  Negative for depression and suicidal ideas. The patient is not nervous/anxious and does not have insomnia.       Objective:     BP (!) 136/90 (BP Location: Left Arm, Patient Position: Sitting, Cuff Size: Large)   Pulse 64   Temp 98.3 F (36.8 C) (Oral)   Resp 18   Ht 5\' 3"  (1.6 m)   Wt 180 lb 3.2 oz (81.7 kg)   LMP 02/26/2021 (Approximate)   SpO2 98%   BMI 31.92 kg/m     Physical Exam Vitals and nursing note reviewed.  Constitutional:      General: She is not in acute distress.    Appearance: Normal appearance. She is well-developed.  HENT:     Head: Normocephalic and atraumatic.  Eyes:     General: No scleral icterus.       Right eye: No discharge.        Left eye: No discharge.  Cardiovascular:     Rate and Rhythm: Normal rate and regular rhythm.     Heart sounds: No murmur heard. Pulmonary:  Effort: Pulmonary effort is normal. No respiratory distress.     Breath sounds: Normal breath sounds.  Musculoskeletal:        General: Normal range of motion.     Cervical back: Normal range of motion and neck supple.     Right lower leg: No edema.     Left lower leg: No edema.  Skin:    General: Skin is warm and dry.  Neurological:     Mental Status: She is alert and oriented to person, place, and time.  Psychiatric:        Mood and Affect: Mood normal.        Behavior: Behavior normal.         Thought Content: Thought content normal.        Judgment: Judgment normal.      No results found for any visits on 02/08/24.     The ASCVD Risk score (Arnett DK, et al., 2019) failed to calculate for the following reasons:   Cannot find a previous HDL lab   Cannot find a previous total cholesterol lab    Assessment & Plan:   Problem List Items Addressed This Visit       Unprioritized   Preventative health care - Primary   Relevant Orders   CBC with Differential/Platelet   Comprehensive metabolic panel with GFR   Lipid panel   TSH   IDA (iron  deficiency anemia)   Relevant Orders   Iron , TIBC and Ferritin Panel   Other Visit Diagnoses       Bilateral carpal tunnel syndrome       Relevant Medications   cyclobenzaprine (FLEXERIL) 10 MG tablet   Other Relevant Orders   Ambulatory referral to Orthopedic Surgery     Primary osteoarthritis of both knees       Relevant Medications   cyclobenzaprine (FLEXERIL) 10 MG tablet   Other Relevant Orders   Ambulatory referral to Orthopedic Surgery     Neck pain       Relevant Medications   cyclobenzaprine (FLEXERIL) 10 MG tablet   Other Relevant Orders   DG Cervical Spine Complete     Polyp of colon, unspecified part of colon, unspecified type       Relevant Orders   Ambulatory referral to Gastroenterology     Need for shingles vaccine       Relevant Orders   Zoster Recombinant (Shingrix ) (Completed)     Assessment and Plan Assessment & Plan Carpal Tunnel Syndrome   She experiences bilateral hand pain and a burning sensation, worsening over time and occurring during activities like driving. Symptoms suggest carpal tunnel syndrome, and previous splint use was ineffective. Refer her to [facility] in [place] for specialist evaluation. Advise her to call if no contact from the specialist within a week.  Arthritis of Both Knees   She has chronic bilateral knee pain, likely due to arthritis, with a history of meniscal  surgery and previous cortisone injections. Symptoms are bothersome enough to warrant further intervention. Refer her to an orthopedic specialist for evaluation and management.  Neck Pain with Possible Bone Spurs   She reports intermittent neck pain with stiffness, possibly related to previous findings of bone spurs. Pain may radiate to shoulders and arms, suggesting possible cervical radiculopathy. Order a neck x-ray to assess for progression of bone spurs. Prescribe a muscle relaxer for nighttime use to alleviate symptoms. Recommend over-the-counter analgesics such as Tylenol  Arthritis or Motrin  for pain management.  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 Maudine Sos, with no new symptoms reported.  General Health Maintenance   Routine health maintenance was discussed. She is due for a colonoscopy and has a history of polyps. She has not received the shingles vaccine yet. Schedule a colonoscopy as she is due for follow-up. Administer the first dose of the shingles vaccine today, with the second dose in 2-6 months. Encourage regular dental check-ups and eye exams.    No follow-ups on file.    Cace Osorto R Lowne Chase, DO

## 2024-02-08 NOTE — Assessment & Plan Note (Signed)
 Ghm utd Check labs  See AVS Health Maintenance  Topic Date Due   HIV Screening  Never done   Hepatitis C Screening  Never done   Pneumococcal Vaccine 41-53 Years old (1 of 2 - PCV) Never done   Cervical Cancer Screening (HPV/Pap Cotest)  12/09/2014   Colonoscopy  12/01/2020   MAMMOGRAM  09/17/2021   COVID-19 Vaccine (1 - 2024-25 season) Never done   Zoster Vaccines- Shingrix (2 of 2) 04/04/2024   INFLUENZA VACCINE  04/19/2024   DTaP/Tdap/Td (5 - Td or Tdap) 08/16/2028   HPV VACCINES  Aged Out   Meningococcal B Vaccine  Aged Out

## 2024-02-08 NOTE — Patient Instructions (Signed)
 Preventive Care 16-53 Years Old, Female  Preventive care refers to lifestyle choices and visits with your health care provider that can promote health and wellness. Preventive care visits are also called wellness exams.  What can I expect for my preventive care visit?  Counseling  Your health care provider may ask you questions about your:  Medical history, including:  Past medical problems.  Family medical history.  Pregnancy history.  Current health, including:  Menstrual cycle.  Method of birth control.  Emotional well-being.  Home life and relationship well-being.  Sexual activity and sexual health.  Lifestyle, including:  Alcohol, nicotine or tobacco, and drug use.  Access to firearms.  Diet, exercise, and sleep habits.  Work and work Astronomer.  Sunscreen use.  Safety issues such as seatbelt and bike helmet use.  Physical exam  Your health care provider will check your:  Height and weight. These may be used to calculate your BMI (body mass index). BMI is a measurement that tells if you are at a healthy weight.  Waist circumference. This measures the distance around your waistline. This measurement also tells if you are at a healthy weight and may help predict your risk of certain diseases, such as type 2 diabetes and high blood pressure.  Heart rate and blood pressure.  Body temperature.  Skin for abnormal spots.  What immunizations do I need?    Vaccines are usually given at various ages, according to a schedule. Your health care provider will recommend vaccines for you based on your age, medical history, and lifestyle or other factors, such as travel or where you work.  What tests do I need?  Screening  Your health care provider may recommend screening tests for certain conditions. This may include:  Lipid and cholesterol levels.  Diabetes screening. This is done by checking your blood sugar (glucose) after you have not eaten for a while (fasting).  Pelvic exam and Pap test.  Hepatitis B test.  Hepatitis C  test.  HIV (human immunodeficiency virus) test.  STI (sexually transmitted infection) testing, if you are at risk.  Lung cancer screening.  Colorectal cancer screening.  Mammogram. Talk with your health care provider about when you should start having regular mammograms. This may depend on whether you have a family history of breast cancer.  BRCA-related cancer screening. This may be done if you have a family history of breast, ovarian, tubal, or peritoneal cancers.  Bone density scan. This is done to screen for osteoporosis.  Talk with your health care provider about your test results, treatment options, and if necessary, the need for more tests.  Follow these instructions at home:  Eating and drinking    Eat a diet that includes fresh fruits and vegetables, whole grains, lean protein, and low-fat dairy products.  Take vitamin and mineral supplements as recommended by your health care provider.  Do not drink alcohol if:  Your health care provider tells you not to drink.  You are pregnant, may be pregnant, or are planning to become pregnant.  If you drink alcohol:  Limit how much you have to 0-1 drink a day.  Know how much alcohol is in your drink. In the [place] one drink equals one 12 oz bottle of beer (355 mL), one 5 oz glass of wine (148 mL), or one 1 oz glass of hard liquor (44 mL).  Lifestyle  Brush your teeth every morning and night with fluoride toothpaste. Floss one time each day.  Exercise for at least  30 minutes 5 or more days each week.  Do not use any products that contain nicotine or tobacco. These products include cigarettes, chewing tobacco, and vaping devices, such as e-cigarettes. If you need help quitting, ask your health care provider.  Do not use drugs.  If you are sexually active, practice safe sex. Use a condom or other form of protection to prevent STIs.  If you do not wish to become pregnant, use a form of birth control. If you plan to become pregnant, see your health care provider for a  prepregnancy visit.  Take aspirin only as told by your health care provider. Make sure that you understand how much to take and what form to take. Work with your health care provider to find out whether it is safe and beneficial for you to take aspirin daily.  Find healthy ways to manage stress, such as:  Meditation, yoga, or listening to music.  Journaling.  Talking to a trusted person.  Spending time with friends and family.  Minimize exposure to UV radiation to reduce your risk of skin cancer.  Safety  Always wear your seat belt while driving or riding in a vehicle.  Do not drive:  If you have been drinking alcohol. Do not ride with someone who has been drinking.  When you are tired or distracted.  While texting.  If you have been using any mind-altering substances or drugs.  Wear a helmet and other protective equipment during sports activities.  If you have firearms in your house, make sure you follow all gun safety procedures.  Seek help if you have been physically or sexually abused.  What's next?  Visit your health care provider once a year for an annual wellness visit.  Ask your health care provider how often you should have your eyes and teeth checked.  Stay up to date on all vaccines.  This information is not intended to replace advice given to you by your health care provider. Make sure you discuss any questions you have with your health care provider.  Document Revised: 03/03/2021 Document Reviewed: 03/03/2021  Elsevier Patient Education  2024 [employer].

## 2024-02-09 LAB — CBC WITH DIFFERENTIAL/PLATELET
Basophils Absolute: 0.1 10*3/uL (ref 0.0–0.1)
Basophils Relative: 1.3 % (ref 0.0–3.0)
Eosinophils Absolute: 0.2 10*3/uL (ref 0.0–0.7)
Eosinophils Relative: 4.2 % (ref 0.0–5.0)
HCT: 38.6 % (ref 36.0–46.0)
Hemoglobin: 12.8 g/dL (ref 12.0–15.0)
Lymphocytes Relative: 49.2 % — ABNORMAL HIGH (ref 12.0–46.0)
Lymphs Abs: 2.1 10*3/uL (ref 0.7–4.0)
MCHC: 33.1 g/dL (ref 30.0–36.0)
MCV: 83.5 fl (ref 78.0–100.0)
Monocytes Absolute: 0.5 10*3/uL (ref 0.1–1.0)
Monocytes Relative: 10.8 % (ref 3.0–12.0)
Neutro Abs: 1.5 10*3/uL (ref 1.4–7.7)
Neutrophils Relative %: 34.5 % — ABNORMAL LOW (ref 43.0–77.0)
Platelets: 346 10*3/uL (ref 150.0–400.0)
RBC: 4.63 Mil/uL (ref 3.87–5.11)
RDW: 14.1 % (ref 11.5–15.5)
WBC: 4.3 10*3/uL (ref 4.0–10.5)

## 2024-02-09 LAB — COMPREHENSIVE METABOLIC PANEL WITH GFR
ALT: 14 U/L (ref 0–35)
AST: 16 U/L (ref 0–37)
Albumin: 4.2 g/dL (ref 3.5–5.2)
Alkaline Phosphatase: 65 U/L (ref 39–117)
BUN: 10 mg/dL (ref 6–23)
CO2: 29 meq/L (ref 19–32)
Calcium: 10 mg/dL (ref 8.4–10.5)
Chloride: 104 meq/L (ref 96–112)
Creatinine, Ser: 0.65 mg/dL (ref 0.40–1.20)
GFR: 100.84 mL/min (ref >60.00)
Glucose, Bld: 78 mg/dL (ref 70–99)
Potassium: 4 meq/L (ref 3.5–5.1)
Sodium: 141 meq/L (ref 135–145)
Total Bilirubin: 0.9 mg/dL (ref 0.2–1.2)
Total Protein: 7.5 g/dL (ref 6.0–8.3)

## 2024-02-09 LAB — IRON,TIBC AND FERRITIN PANEL
%SAT: 35 % (ref 16–45)
Ferritin: 38 ng/mL (ref 16–232)
Iron: 123 ug/dL (ref 45–160)
TIBC: 353 ug/dL (ref 250–450)

## 2024-02-09 LAB — LIPID PANEL
Cholesterol: 224 mg/dL — ABNORMAL HIGH (ref 0–200)
HDL: 48.7 mg/dL (ref >39.00)
LDL Cholesterol: 155 mg/dL — ABNORMAL HIGH (ref 0–99)
NonHDL: 175.5
Total CHOL/HDL Ratio: 5
Triglycerides: 105 mg/dL (ref 0.0–149.0)
VLDL: 21 mg/dL (ref 0.0–40.0)

## 2024-02-09 LAB — TSH: TSH: 0.49 u[IU]/mL (ref 0.35–5.50)

## 2024-02-18 ENCOUNTER — Ambulatory Visit: Payer: Self-pay | Admitting: Family Medicine

## 2024-04-18 ENCOUNTER — Ambulatory Visit: Payer: Self-pay

## 2024-04-18 ENCOUNTER — Encounter (HOSPITAL_BASED_OUTPATIENT_CLINIC_OR_DEPARTMENT_OTHER): Payer: Self-pay | Admitting: Emergency Medicine

## 2024-04-18 ENCOUNTER — Emergency Department (HOSPITAL_BASED_OUTPATIENT_CLINIC_OR_DEPARTMENT_OTHER)
Admission: EM | Admit: 2024-04-18 | Discharge: 2024-04-18 | Disposition: A | Discharge status: Home / Self Care | Attending: Emergency Medicine | Admitting: Emergency Medicine

## 2024-04-18 ENCOUNTER — Emergency Department (HOSPITAL_BASED_OUTPATIENT_CLINIC_OR_DEPARTMENT_OTHER)

## 2024-04-18 ENCOUNTER — Other Ambulatory Visit: Payer: Self-pay

## 2024-04-18 DIAGNOSIS — R42 Dizziness and giddiness: Secondary | ICD-10-CM | POA: Diagnosis present

## 2024-04-18 DIAGNOSIS — R262 Difficulty in walking, not elsewhere classified: Secondary | ICD-10-CM | POA: Diagnosis not present

## 2024-04-18 DIAGNOSIS — R112 Nausea with vomiting, unspecified: Secondary | ICD-10-CM | POA: Insufficient documentation

## 2024-04-18 LAB — CBC
HCT: 36 % (ref 36.0–46.0)
Hemoglobin: 12.1 g/dL (ref 12.0–15.0)
MCH: 27.8 pg (ref 26.0–34.0)
MCHC: 33.6 g/dL (ref 30.0–36.0)
MCV: 82.6 fL (ref 80.0–100.0)
Platelets: 299 K/uL (ref 150–400)
RBC: 4.36 MIL/uL (ref 3.87–5.11)
RDW: 13.2 % (ref 11.5–15.5)
WBC: 5 K/uL (ref 4.0–10.5)
nRBC: 0 % (ref 0.0–0.2)

## 2024-04-18 LAB — COMPREHENSIVE METABOLIC PANEL WITH GFR
ALT: 12 U/L (ref 0–44)
AST: 20 U/L (ref 15–41)
Albumin: 4.2 g/dL (ref 3.5–5.0)
Alkaline Phosphatase: 80 U/L (ref 38–126)
Anion gap: 10 (ref 5–15)
BUN: 11 mg/dL (ref 6–20)
CO2: 24 mmol/L (ref 22–32)
Calcium: 9.6 mg/dL (ref 8.9–10.3)
Chloride: 108 mmol/L (ref 98–111)
Creatinine, Ser: 0.7 mg/dL (ref 0.44–1.00)
GFR, Estimated: >60 mL/min (ref >60)
Glucose, Bld: 86 mg/dL (ref 70–99)
Potassium: 4 mmol/L (ref 3.5–5.1)
Sodium: 142 mmol/L (ref 135–145)
Total Bilirubin: 0.7 mg/dL (ref 0.0–1.2)
Total Protein: 7.5 g/dL (ref 6.5–8.1)

## 2024-04-18 LAB — URINALYSIS, ROUTINE W REFLEX MICROSCOPIC
Bilirubin Urine: NEGATIVE
Glucose, UA: NEGATIVE mg/dL
Hgb urine dipstick: NEGATIVE
Ketones, ur: NEGATIVE mg/dL
Leukocytes,Ua: NEGATIVE
Nitrite: NEGATIVE
Protein, ur: NEGATIVE mg/dL
Specific Gravity, Urine: 1.015 (ref 1.005–1.030)
pH: 7.5 (ref 5.0–8.0)

## 2024-04-18 LAB — CBG MONITORING, ED: Glucose-Capillary: 88 mg/dL (ref 70–99)

## 2024-04-18 MED ORDER — MECLIZINE HCL 25 MG PO TABS
50.0000 mg | ORAL_TABLET | Freq: Once | ORAL | Status: AC
  Administered 2024-04-18: 50 mg via ORAL
  Filled 2024-04-18: qty 2

## 2024-04-18 MED ORDER — MECLIZINE HCL 25 MG PO TABS: 25.0000 mg | ORAL_TABLET | Freq: Three times a day (TID) | ORAL | 0 refills | Status: AC | PRN

## 2024-04-18 NOTE — ED Triage Notes (Signed)
 Pt POV c/o head spinning/dizziness x 3 days. Emesis x2 today. Denies fever.

## 2024-04-18 NOTE — ED Notes (Signed)
 Pt still complains of a little bit of dizziness when ambulating in the hallway.

## 2024-04-18 NOTE — ED Notes (Signed)

## 2024-04-18 NOTE — Discharge Instructions (Addendum)
 Please read and follow all provided instructions.  Your diagnoses today include:  1. Vertigo     Tests performed today include: Complete blood cell count: Was normal Basic metabolic panel: Was normal Urinalysis (urine test): No signs of infection CT scan of the head was normal, no signs of stroke or other problems Vital signs. See below for your results today.   Medications prescribed:  Meclizine : Oral medication for dizziness/vertigo  Take any prescribed medications only as directed.  Home care instructions:  Follow any educational materials contained in this packet.  BE VERY CAREFUL not to take multiple medicines containing Tylenol  (also called acetaminophen ). Doing so can lead to an overdose which can damage your liver and cause liver failure and possibly death.   Follow-up instructions: Please follow-up with your primary care provider in the next 3 days for further evaluation of your symptoms.   Return instructions:  Please return to the Emergency Department if you experience worsening symptoms.  Return if you have weakness in your arms or legs, slurred speech, trouble walking or talking, confusion, or trouble with your balance and worsening difficulty walking.  Please return if you have any other emergent concerns.  Additional Information:  Your vital signs today were: BP 138/80   Pulse (!) 58   Temp 98.2 F (36.8 C)   Resp 16   Ht 5' 3 (1.6 m)   Wt 81.6 kg   LMP 02/26/2021 (Approximate)   SpO2 96%   BMI 31.89 kg/m  If your blood pressure (BP) was elevated above 135/85 this visit, please have this repeated by your doctor within one month. --------------

## 2024-04-18 NOTE — ED Provider Notes (Signed)
 [facility] Provider Note   CSN: [id number] Arrival date & time: 04/18/24  1158     Patient presents with: Dizziness   Danielle Burns is a 53 y.o. female.   Patient presents to the emergency department for evaluation of vertigo.  Today is day 3 of symptoms.  She has had a couple of episodes of vomiting with this today.  Symptoms are worse with movement of her head and standing up.  Symptoms are less severe today than they had been.  Last night she was having some difficulty walking, almost fell into her dresser. Patient denies signs of stroke including: facial droop, slurred speech, aphasia, weakness/numbness in extremities, imbalance/trouble walking.  Reports colonoscopy about 8 days ago.  She has had some nausea since that procedure.  No abdominal pain currently.       Prior to Admission medications   Medication Sig Start Date End Date Taking? Authorizing Provider  cetirizine (ZYRTEC) 10 MG tablet Take 10 mg by mouth daily as needed for allergies.    [provider]  cyclobenzaprine  (FLEXERIL ) 10 MG tablet Take 1 tablet (10 mg total) by mouth 3 (three) times daily as needed for muscle spasms. 02/08/24   Lowne Chase, Yvonne R, DO  fluticasone  (FLONASE ) 50 MCG/ACT nasal spray Place 1-2 sprays into both nostrils daily as needed for allergies or rhinitis. 12/26/23   Lowne Chase, Yvonne R, DO  Multiple Vitamin (MULTIVITAMIN) tablet Take 1 tablet by mouth 2 (two) times a week. Reported on 02/20/2016    [provider]  Vitamin D , Ergocalciferol , (DRISDOL ) 1.25 MG (50000 UNIT) CAPS capsule Take 1 capsule (50,000 Units total) by mouth every 7 (seven) days. 02/25/21   Lowne Chase, Yvonne R, DO    Allergies: Patient has no known allergies.    Review of Systems  Updated Vital Signs BP (!) 146/86 (BP Location: Left Arm)   Pulse 71   Temp 98.2 F (36.8 C) (Oral)   Resp 16   Ht 5' 3 (1.6 m)   Wt 81.6 kg   LMP 02/26/2021 (Approximate)    SpO2 96%   BMI 31.89 kg/m   Physical Exam Vitals and nursing note reviewed.  Constitutional:      Appearance: She is well-developed.  HENT:     Head: Normocephalic and atraumatic.     Right Ear: Tympanic membrane, ear canal and external ear normal.     Left Ear: Tympanic membrane, ear canal and external ear normal.     Ears:     Comments: Canals clear, TMs appear normal    Nose: Nose normal.     Mouth/Throat:     Pharynx: Uvula midline.  Eyes:     General: Lids are normal.     Extraocular Movements:     Right eye: No nystagmus.     Left eye: No nystagmus.     Conjunctiva/sclera: Conjunctivae normal.     Pupils: Pupils are equal, round, and reactive to light.     Comments: No significant nystagmus noted.  Cardiovascular:     Rate and Rhythm: Normal rate and regular rhythm.  Pulmonary:     Effort: Pulmonary effort is normal.     Breath sounds: Normal breath sounds.  Abdominal:     Palpations: Abdomen is soft.     Tenderness: There is no abdominal tenderness.  Musculoskeletal:     Cervical back: Normal range of motion and neck supple. No tenderness or bony tenderness.  Skin:  General: Skin is warm and dry.  Neurological:     Mental Status: She is alert and oriented to person, place, and time.     GCS: GCS eye subscore is 4. GCS verbal subscore is 5. GCS motor subscore is 6.     Cranial Nerves: No cranial nerve deficit.     Sensory: No sensory deficit.     Motor: No weakness.     Coordination: Coordination normal.     Comments: Upper extremity myotomes tested bilaterally:  C5 Shoulder abduction 5/5 C6 Elbow flexion/wrist extension 5/5 C7 Elbow extension 5/5 C8 Finger flexion 5/5 T1 Finger abduction 5/5  Lower extremity myotomes tested bilaterally: L2 Hip flexion 5/5 L3 Knee extension 5/5 L4 Ankle dorsiflexion 5/5 S1 Ankle plantar flexion 5/5      (all labs ordered are listed, but only abnormal results are displayed) Labs Reviewed  COMPREHENSIVE METABOLIC  PANEL WITH GFR  CBC  URINALYSIS, ROUTINE W REFLEX MICROSCOPIC  CBG MONITORING, ED    EKG: None  Radiology: CT Head Wo Contrast Result Date: 04/18/2024 CLINICAL DATA:  Neuro deficit, acute, stroke suspected vertigo x 3 days EXAM: CT HEAD WITHOUT CONTRAST TECHNIQUE: Contiguous axial images were obtained from the base of the skull through the vertex without intravenous contrast. RADIATION DOSE REDUCTION: This exam was performed according to the departmental dose-optimization program which includes automated exposure control, adjustment of the mA and/or kV according to patient size and/or use of iterative reconstruction technique. COMPARISON:  None Available. FINDINGS: Brain: Normal brain. No evidence of hemorrhage, mass, cortical infarct or hydrocephalus. Vascular: Negative. Skull: Intact and unremarkable. Sinuses/Orbits: Polypoid mucous retention cyst within the left sphenoid sinus. The paranasal sinuses and mastoid air cells are otherwise clear. Normal orbits. Other: None. IMPRESSION: 1. Normal brain. 2. Mucous retention cyst within the left sphenoid sinus. Electronically Signed   By: Evalene Coho M.D.   On: 04/18/2024 15:14     Procedures   Medications Ordered in the ED  meclizine  (ANTIVERT ) tablet 50 mg (has no administration in time range)   ED Course  Patient seen and examined. History obtained directly from patient. Work-up including labs, imaging, EKG ordered in triage, if performed, were reviewed.    Labs/EKG: Independently reviewed and interpreted.  This included: CBC unremarkable; CMP unremarkable; UA normal.  Imaging: Ordered CT head  Medications/Fluids: Ordered: Meclizine  p.o.  Most recent vital signs reviewed and are as follows: BP (!) 146/86 (BP Location: Left Arm)   Pulse 71   Temp 98.2 F (36.8 C) (Oral)   Resp 16   Ht 5' 3 (1.6 m)   Wt 81.6 kg   LMP 02/26/2021 (Approximate)   SpO2 96%   BMI 31.89 kg/m   Initial impression: Vertigo with vomiting  4:14  PM Reassessment performed. Patient appears stable, states that she is feeling better.  She was able to ambulate in the department with some dizziness but without assistance.  She states that dizziness is improved from earlier with meclizine .  Imaging personally visualized and interpreted including: CT head, agree negative.  Reviewed pertinent lab work and imaging with patient at bedside. Questions answered.   Most current vital signs reviewed and are as follows: BP 138/80   Pulse (!) 58   Temp 98.2 F (36.8 C)   Resp 16   Ht 5' 3 (1.6 m)   Wt 81.6 kg   LMP 02/26/2021 (Approximate)   SpO2 96%   BMI 31.89 kg/m   Plan: Discharge to home.  We did discuss additional evaluation  with MRI, however given clinical improvement, lack of other neurologic symptoms, ability to ambulate well --discussed less concern for stroke and more concern for peripheral vertigo.  We discussed signs and symptoms which should be concerning and cause her to return for further evaluation, she agrees to do this.  She is comfortable with plan.  Prescriptions written for: Meclizine   Other home care instructions discussed: Close monitoring of symptoms  ED return instructions discussed: Patient counseled to return if they have weakness in their arms or legs, slurred speech, trouble walking or talking, confusion, trouble with their balance, or if they have any other concerns. Patient verbalizes understanding and agrees with plan.   Follow-up instructions discussed: Patient encouraged to follow-up with their PCP in 3 days.                                   Medical Decision Making Amount and/or Complexity of Data Reviewed Labs: ordered. Radiology: ordered.   Patient presents with several days of vertigo type symptoms, worse with movement and head movements.  This is a new problem for the patient.  She otherwise has a normal neuro exam.  Head CT was negative.  Lab workup is all normal.  Patient was treated with  meclizine , subjectively improving.  Clinically she is able to ambulate without difficulty or assistance.  She looks well.  Considered transfer for MRI, however at this point overall low concern for central etiology of the patient's symptoms.  The patient's vital signs, pertinent lab work and imaging were reviewed and interpreted as discussed in the ED course. Hospitalization was considered for further testing, treatments, or serial exams/observation. However as patient is well-appearing, has a stable exam, and reassuring studies today, I do not feel that they warrant admission at this time. This plan was discussed with the patient who verbalizes agreement and comfort with this plan and seems reliable and able to return to the Emergency Department with worsening or changing symptoms.       Final diagnoses:  Vertigo    ED Discharge Orders          Ordered    meclizine  (ANTIVERT ) 25 MG tablet  3 times daily PRN        04/18/24 1612               Desiderio Chew, PA-C 04/18/24 1617    Elnor Bernarda SQUIBB, DO 04/26/24 [id number]

## 2024-04-18 NOTE — Telephone Encounter (Signed)
 FYI Only or Action Required?: FYI only for provider.  Patient was last seen in primary care on 02/08/2024 by Antonio Meth, Jamee SAUNDERS, DO.  Called Nurse Triage reporting Dizziness, Vomiting, and Pallor.  Symptoms began several days ago.  Interventions attempted: Rest, hydration, or home remedies.  Symptoms are: ebbs and flows, severe at times.  Triage Disposition: Go to ED Now (or PCP Triage)  Patient/caregiver understands and will follow disposition?: Yes       Copied from CRM #[id number]. Topic: Clinical - Red Word Triage >> Apr 18, 2024 10:54 AM Danielle Burns wrote: Red Word that prompted transfer to Nurse Triage: Patient is calling in because she is dizzy and throwing up for two days.    ----------------------------------------------------------------------- From previous Reason for Contact - Scheduling: Patient/patient representative is calling to schedule an appointment. Refer to attachments for appointment information. Reason for Disposition  Patient sounds very sick or weak to the triager  Answer Assessment - Initial Assessment Questions 1. DESCRIPTION: Describe your dizziness.     Not like about to pass out When get up everything's just floating around me, can hardly do anything, no vision changes  Had colonoscopy the other day then started feeling this way 7/23 colonoscopy, dizziness and vomiting started just couple days ago 2. LIGHTHEADED: Do you feel lightheaded? (e.g., somewhat faint, woozy, weak upon standing)     Not weak upon standing, just dizziness 3. VERTIGO: Do you feel like either you or the room is spinning or tilting? (i.e., vertigo)     yes 4. SEVERITY: How bad is it?  Do you feel like you are going to faint? Can you stand and walk?     Stumbling when walking Not fallen Sometimes having to use furniture for support while walking, other times just have to be really careful, one time almost hit the dresser falling over 5. ONSET:  When did the  dizziness begin?     Couple days ago 6. AGGRAVATING FACTORS: Does anything make it worse? (e.g., standing, change in head position)     Lying down still helps Getting up is terrible 7. HEART RATE: Can you tell me your heart rate? How many beats in 15 seconds?  (Note: Not all patients can do this.)       Not racing 8. CAUSE: What do you think is causing the dizziness? (e.g., decreased fluids or food, diarrhea, emotional distress, heat exposure, new medicine, sudden standing, vomiting; unknown)     Nothing has changed I don't think 9. RECURRENT SYMPTOM: Have you had dizziness before? If Yes, ask: When was the last time? What happened that time?     Had it one time before but didn't last long then it stopped 10. OTHER SYMPTOMS: Do you have any other symptoms? 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  Protocols used: Dizziness - Lightheadedness-A-AH

## 2024-05-02 ENCOUNTER — Encounter: Payer: Self-pay | Admitting: Family

## 2024-08-12 ENCOUNTER — Other Ambulatory Visit

## 2024-08-12 ENCOUNTER — Ambulatory Visit: Admitting: Family Medicine
